# Patient Record
Sex: Male | Born: 1995
Health system: Southern US, Community
[De-identification: ages and names within clinical notes are randomized; demographics above are authoritative.]

## PROBLEM LIST (undated history)

## (undated) DIAGNOSIS — F952 Tourette's disorder: Secondary | ICD-10-CM

## (undated) DIAGNOSIS — J45909 Unspecified asthma, uncomplicated: Secondary | ICD-10-CM

## (undated) DIAGNOSIS — Z91018 Allergy to other foods: Secondary | ICD-10-CM

## (undated) DIAGNOSIS — R569 Unspecified convulsions: Secondary | ICD-10-CM

## (undated) HISTORY — PX: TONSILLECTOMY: SUR1361

---
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
## 2015-05-15 ENCOUNTER — Emergency Department (HOSPITAL_COMMUNITY)
Admission: EM | Admit: 2015-05-15 | Discharge: 2015-05-15 | Disposition: A | Discharge status: Home / Self Care | Payer: 59 | Attending: Emergency Medicine | Admitting: Emergency Medicine

## 2015-05-15 ENCOUNTER — Emergency Department (HOSPITAL_COMMUNITY): Payer: 59

## 2015-05-15 ENCOUNTER — Encounter (HOSPITAL_COMMUNITY): Payer: Self-pay | Admitting: Emergency Medicine

## 2015-05-15 DIAGNOSIS — R109 Unspecified abdominal pain: Secondary | ICD-10-CM | POA: Diagnosis present

## 2015-05-15 DIAGNOSIS — J45909 Unspecified asthma, uncomplicated: Secondary | ICD-10-CM | POA: Diagnosis not present

## 2015-05-15 DIAGNOSIS — I880 Nonspecific mesenteric lymphadenitis: Secondary | ICD-10-CM

## 2015-05-15 HISTORY — DX: Unspecified asthma, uncomplicated: J45.909

## 2015-05-15 HISTORY — DX: Unspecified convulsions: R56.9

## 2015-05-15 LAB — URINALYSIS, ROUTINE W REFLEX MICROSCOPIC
Bilirubin Urine: NEGATIVE
Glucose, UA: NEGATIVE mg/dL
Hgb urine dipstick: NEGATIVE
Ketones, ur: 40 mg/dL — AB
Leukocytes, UA: NEGATIVE
Nitrite: NEGATIVE
Protein, ur: NEGATIVE mg/dL
Specific Gravity, Urine: 1.02 (ref 1.005–1.030)
pH: 6.5 (ref 5.0–8.0)

## 2015-05-15 LAB — CBC
HCT: 48.2 % (ref 39.0–52.0)
Hemoglobin: 16 g/dL (ref 13.0–17.0)
MCH: 28.3 pg (ref 26.0–34.0)
MCHC: 33.2 g/dL (ref 30.0–36.0)
MCV: 85.3 fL (ref 78.0–100.0)
Platelets: 204 10*3/uL (ref 150–400)
RBC: 5.65 MIL/uL (ref 4.22–5.81)
RDW: 13.5 % (ref 11.5–15.5)
WBC: 6.5 10*3/uL (ref 4.0–10.5)

## 2015-05-15 LAB — COMPREHENSIVE METABOLIC PANEL
ALT: 33 U/L (ref 17–63)
AST: 32 U/L (ref 15–41)
Albumin: 5.2 g/dL — ABNORMAL HIGH (ref 3.5–5.0)
Alkaline Phosphatase: 68 U/L (ref 38–126)
Anion gap: 5 (ref 5–15)
BUN: 13 mg/dL (ref 6–20)
CO2: 29 mmol/L (ref 22–32)
Calcium: 9.9 mg/dL (ref 8.9–10.3)
Chloride: 104 mmol/L (ref 101–111)
Creatinine, Ser: 0.92 mg/dL (ref 0.61–1.24)
GFR calc Af Amer: >60 mL/min (ref >60)
GFR calc non Af Amer: >60 mL/min (ref >60)
Glucose, Bld: 115 mg/dL — ABNORMAL HIGH (ref 65–99)
Potassium: 4.9 mmol/L (ref 3.5–5.1)
Sodium: 138 mmol/L (ref 135–145)
Total Bilirubin: 1.3 mg/dL — ABNORMAL HIGH (ref 0.3–1.2)
Total Protein: 8.2 g/dL — ABNORMAL HIGH (ref 6.5–8.1)

## 2015-05-15 LAB — LIPASE, BLOOD: Lipase: 26 U/L (ref 11–51)

## 2015-05-15 IMAGING — DX DG CHEST 2V
2 series · 2 of 2 positions shown · non-contrast
Comparison: [DATE]

CLINICAL DATA: Right-sided chest pain

EXAM:
CHEST  2 VIEW

[chest pa]
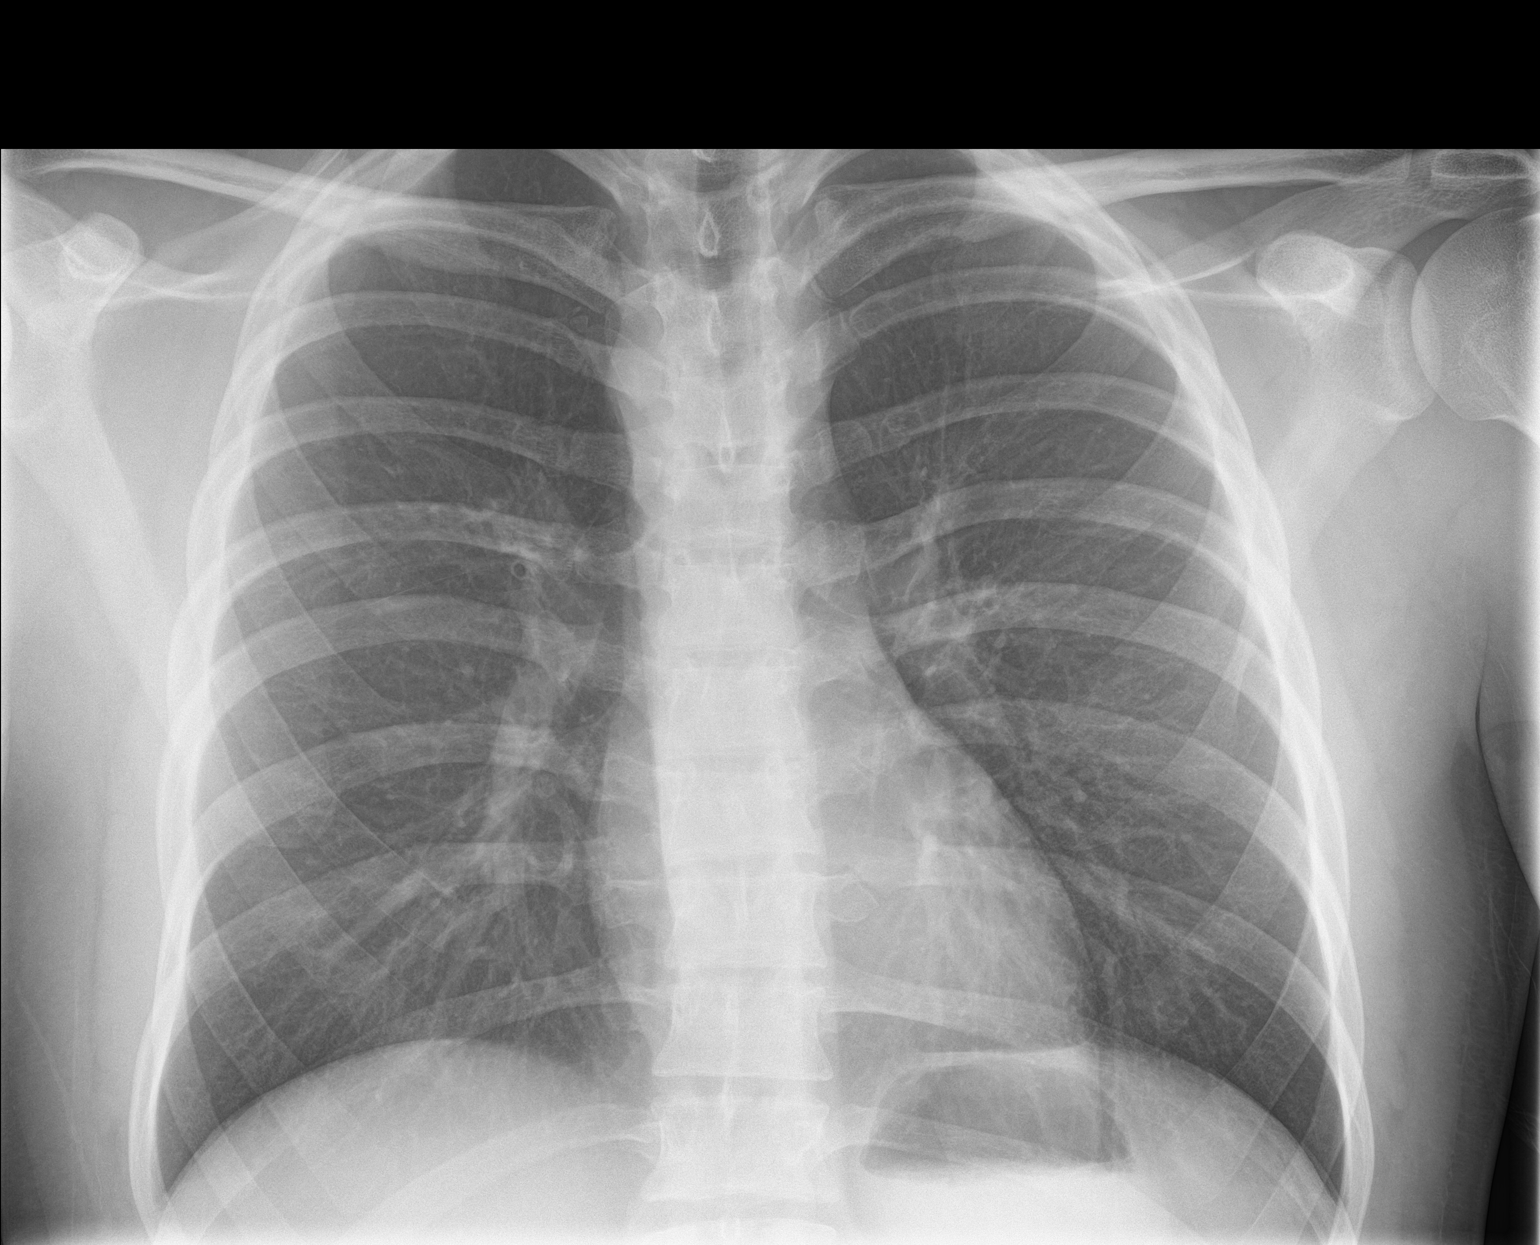

[chest lat]
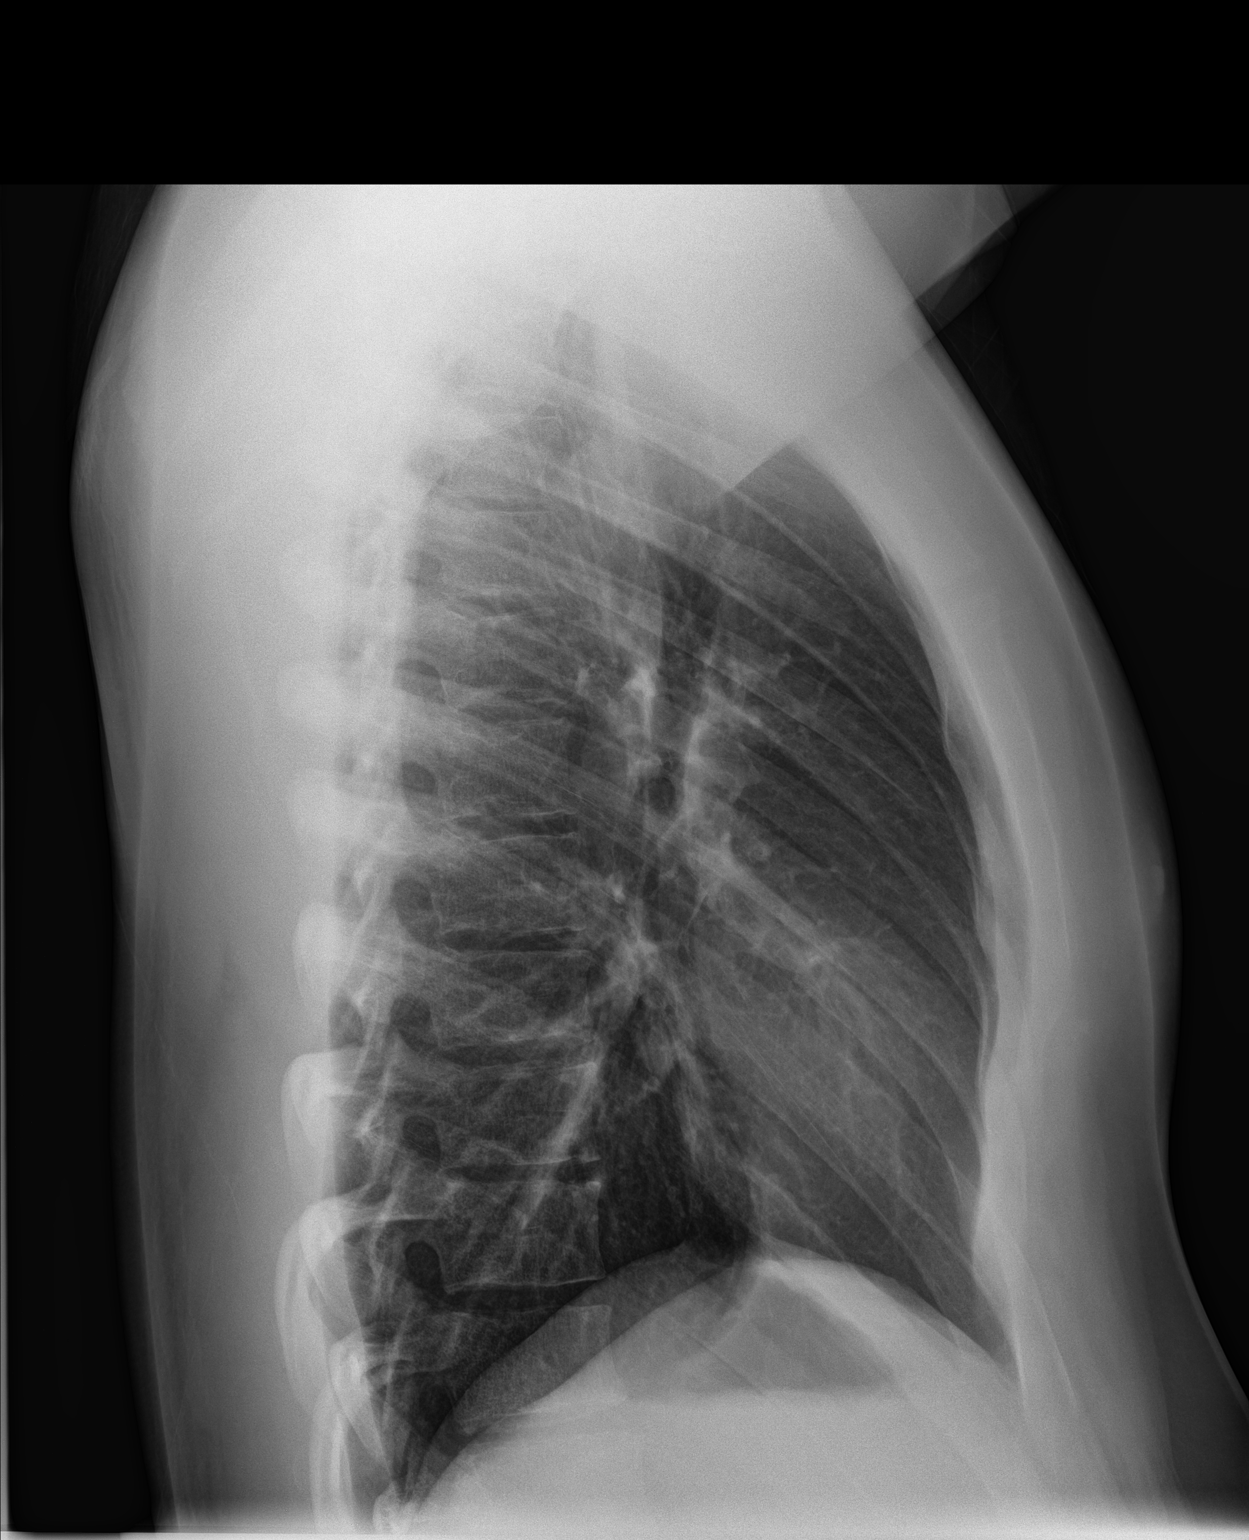

[2 of 2 positions shown; findings below may reference images not displayed]

FINDINGS: The heart size and mediastinal contours are within normal limits.
Both lungs are clear. The visualized skeletal structures are
unremarkable.
IMPRESSION: No active cardiopulmonary disease.

## 2015-05-15 IMAGING — CT CT ABD-PELV W/ CM
2 of 4 series · 16 of 46 positions shown, 18 images · IV contrast (Omnipaque 300)
Comparison: None.

CLINICAL DATA: Right-sided abdominal pain for 1 day.  Nausea.

EXAM:
CT ABDOMEN AND PELVIS WITH CONTRAST
TECHNIQUE: Multidetector CT imaging of the abdomen and pelvis was performed
using the standard protocol following bolus administration of
intravenous contrast. Oral contrast was also administered.
CONTRAST:  100mL OMNIPAQUE IOHEXOL 300 MG/ML  SOLN

[Series 2: abd_pel_with 5.0 b40f · axial · 0.78mm/px · z∈[-576,-131]mm · 13 of 97 slices shown, 15 images]
[im 4/97  soft-tissue]
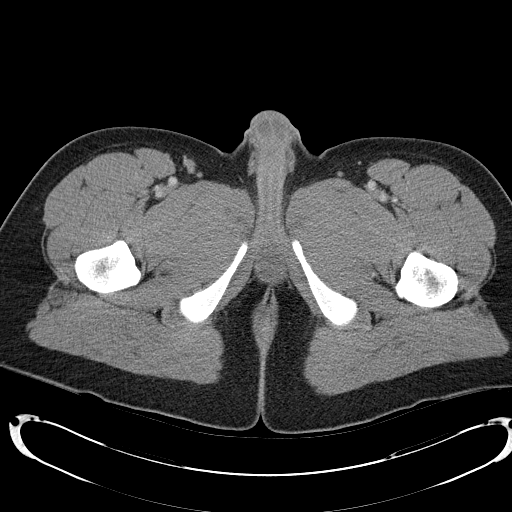
[im 4/97  bone]
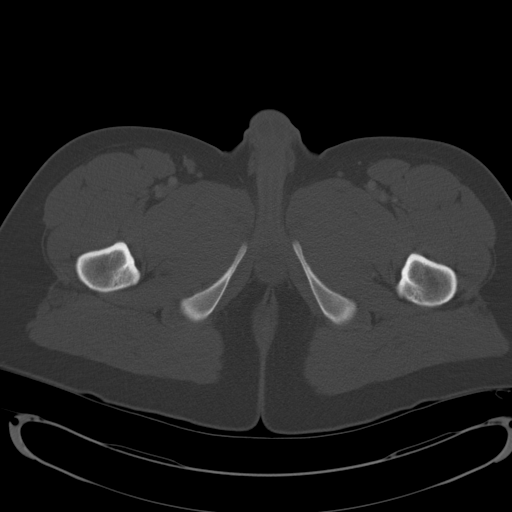
[im 12/97  soft-tissue]
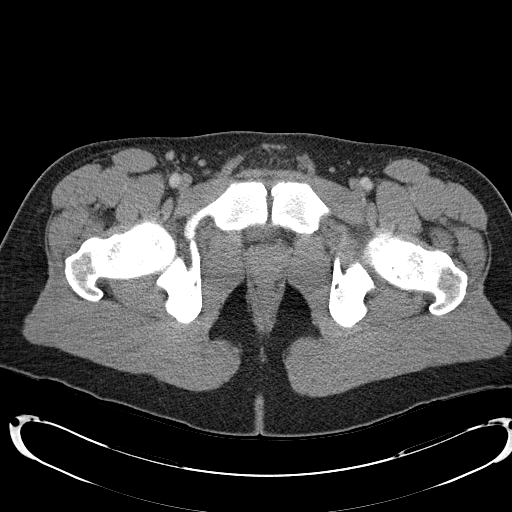
[im 20/97  soft-tissue]
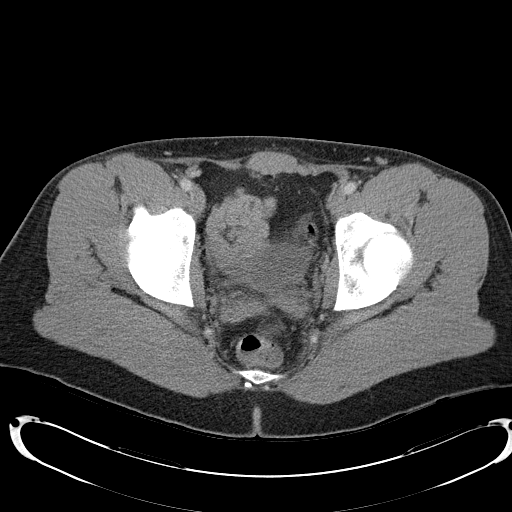
[im 27/97  soft-tissue]
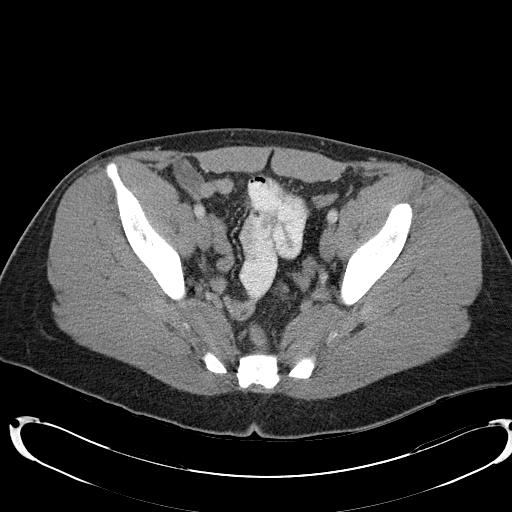
[im 35/97  soft-tissue]
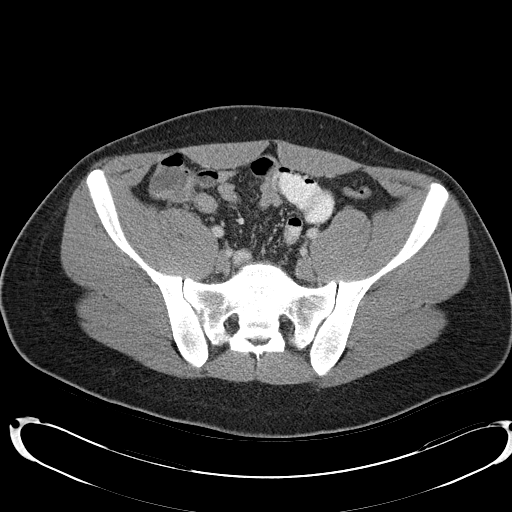
[im 43/97  soft-tissue]
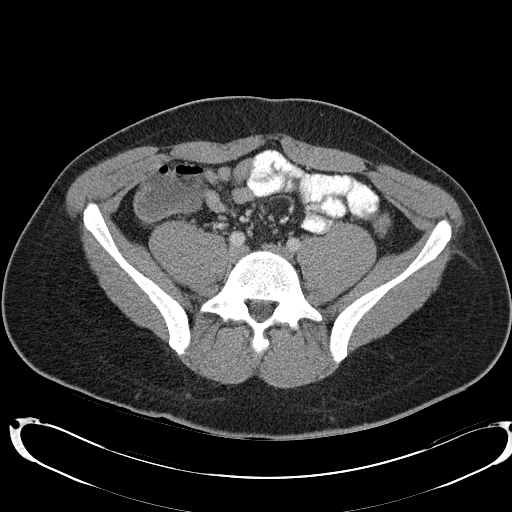
[im 50/97  soft-tissue]
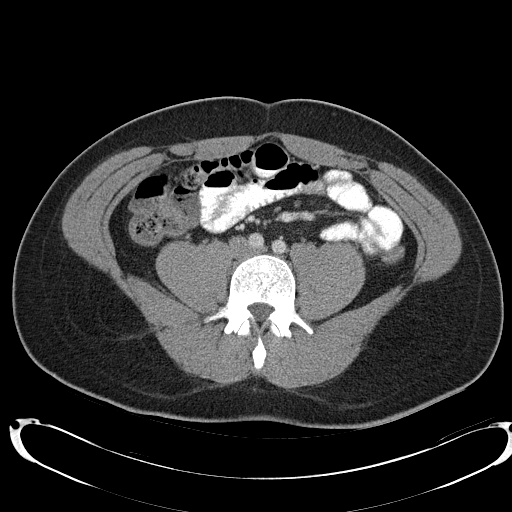
[im 54/97  soft-tissue]
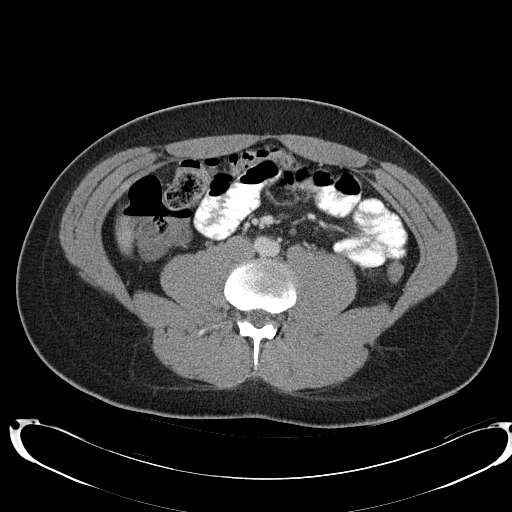
[im 62/97  soft-tissue]
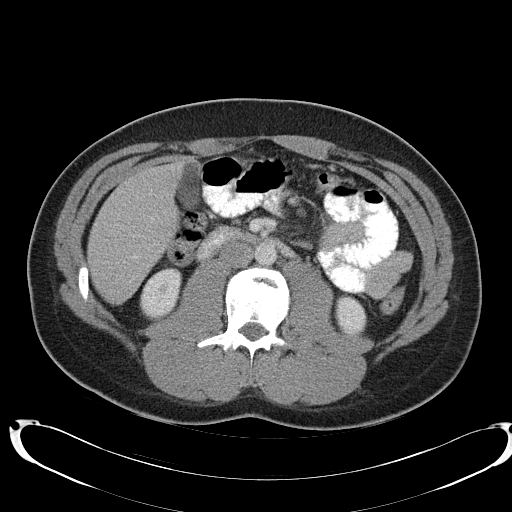
[im 62/97  bone]
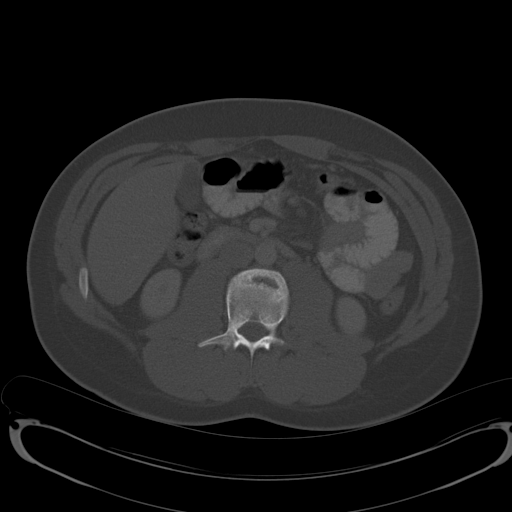
[im 70/97  soft-tissue]
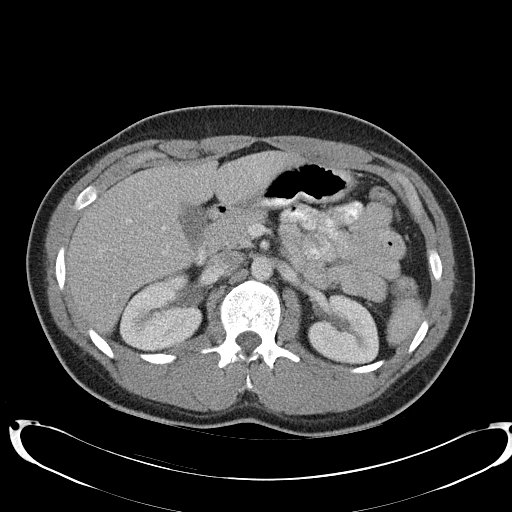
[im 77/97  soft-tissue]
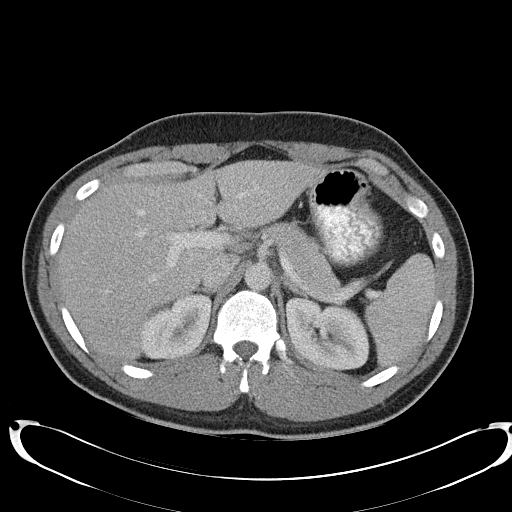
[im 85/97  soft-tissue]
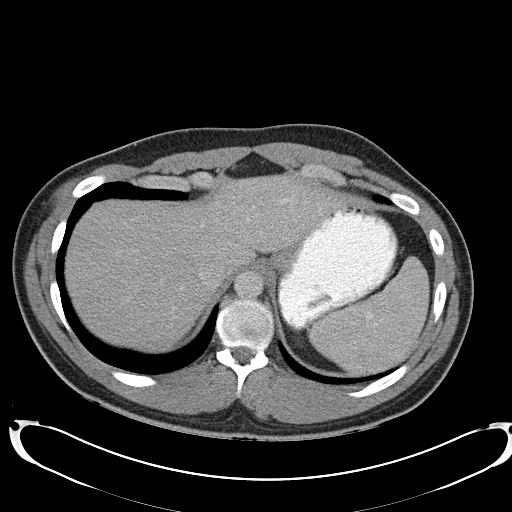
[im 93/97  soft-tissue]
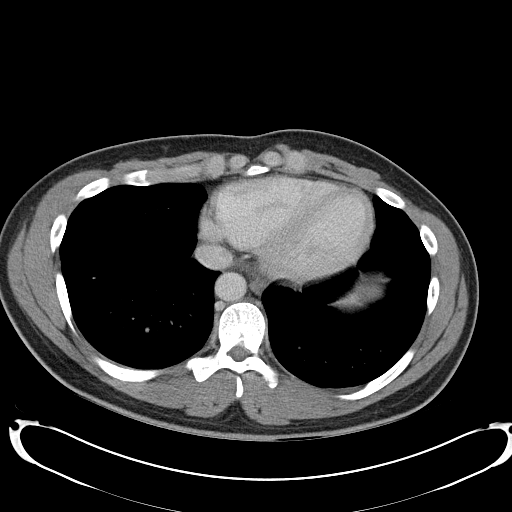

[Series 3: abd_pel_with 3.0 spo cor · coronal · 0.83mm/px · 3 of 81 slices shown]
[im 27/81  soft-tissue]
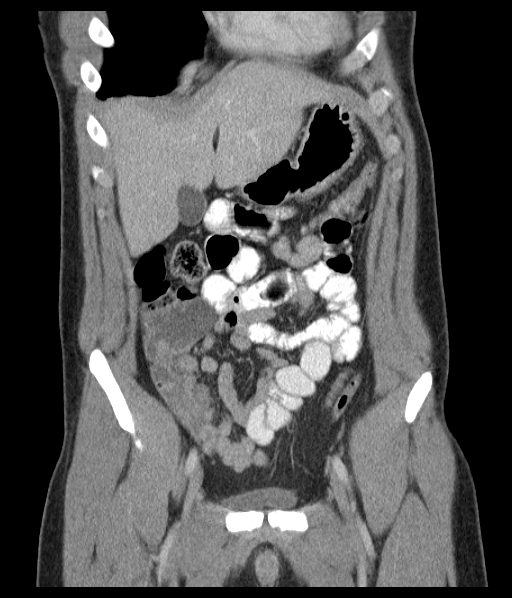
[im 36/81  soft-tissue]
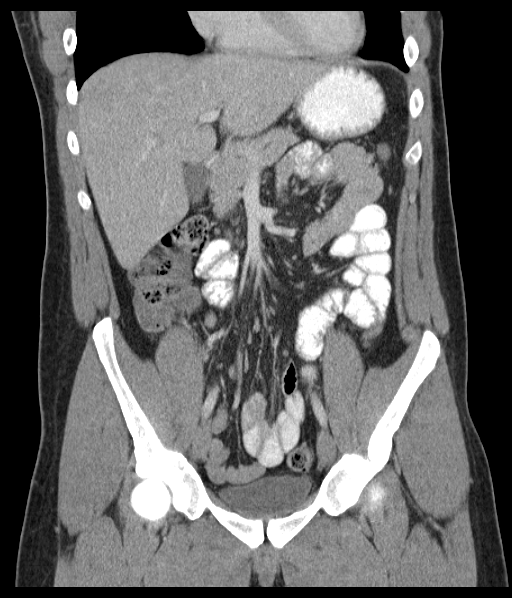
[im 45/81  soft-tissue]
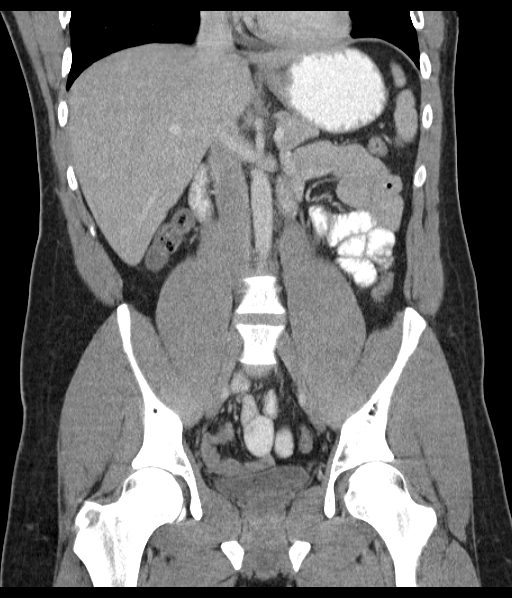

[16 of 46 positions shown; findings below may reference images not displayed]

FINDINGS: Lower chest:  Lung bases are clear.

Hepatobiliary: No focal liver lesions are identified. Gallbladder
wall is not appreciably thickened. There is no biliary duct
dilatation.

Pancreas: There is no demonstrable pancreatic mass or inflammatory
focus.

Spleen: No splenic lesions are identified.

Adrenals/Urinary Tract: Adrenals appear normal bilaterally. Kidneys
bilaterally show no evidence of mass or hydronephrosis on either
side. There is no demonstrable re- or ureteral calculus on either
side. Urinary bladder is midline with wall thickness within normal
limits.

Stomach/Bowel: There is no bowel wall or mesenteric thickening.
There are occasional sigmoid diverticula without diverticulitis.
There is no bowel obstruction. No free air or portal venous air.

Vascular/Lymphatic: No abdominal aortic aneurysm apparent. Major
mesenteric vessels appear patent. There is no appreciable adenopathy
by size criteria in the abdomen or pelvis. There are several
subcentimeter lymph nodes in the right mid to lower abdomen.

Reproductive: Prostate and seminal vesicles appear normal. There is
no demonstrable pelvic mass or pelvic fluid collection.

Other: There is no appendiceal inflammation. No abscess or ascites
is apparent in the abdomen or pelvis. There is a minimal ventral
hernia containing only fat.

Musculoskeletal: There are no blastic or lytic bone lesions. There
is no intramuscular or abdominal wall lesion.
IMPRESSION: There are several subcentimeter right-sided lymph nodes. This
finding potentially could indicate a degree of mesenteric adenitis.
By size criteria, there is no demonstrable adenopathy in the abdomen
or pelvis. There is no appendiceal region inflammation.

No bowel obstruction.  No abscess.

No renal or ureteral calculus.  No hydronephrosis.

There is a minimal ventral hernia containing only fat.

## 2015-05-15 MED ORDER — NAPROXEN 250 MG PO TABS: 250.0000 mg | ORAL_TABLET | Freq: Two times a day (BID) | ORAL | Status: DC | PRN

## 2015-05-15 MED ORDER — HYDROCODONE-ACETAMINOPHEN 5-325 MG PO TABS: ORAL_TABLET | ORAL | Status: DC

## 2015-05-15 MED ORDER — MORPHINE SULFATE (PF) 4 MG/ML IV SOLN
4.0000 mg | INTRAVENOUS | Status: DC | PRN
  Administered 2015-05-15: 4 mg via INTRAVENOUS
  Filled 2015-05-15: qty 1

## 2015-05-15 MED ORDER — ONDANSETRON HCL 4 MG/2ML IJ SOLN
4.0000 mg | INTRAMUSCULAR | Status: DC | PRN
Start: 2015-05-15 — End: 2015-05-15
  Administered 2015-05-15: 4 mg via INTRAVENOUS
  Filled 2015-05-15: qty 2

## 2015-05-15 MED ORDER — IOHEXOL 300 MG/ML  SOLN
100.0000 mL | Freq: Once | INTRAMUSCULAR | Status: AC | PRN
  Administered 2015-05-15: 100 mL via INTRAVENOUS

## 2015-05-15 MED ORDER — DIATRIZOATE MEGLUMINE & SODIUM 66-10 % PO SOLN
ORAL | Status: AC
  Administered 2015-05-15: 15 mL
  Filled 2015-05-15: qty 30

## 2015-05-15 MED ORDER — SODIUM CHLORIDE 0.9 % IV SOLN
INTRAVENOUS | Status: DC
  Administered 2015-05-15: 13:00:00 via INTRAVENOUS

## 2015-05-15 NOTE — ED Provider Notes (Signed)
CSN: [id number]     [id number] & time 05/15/15  1045 History   First MD Initiated Contact with Patient 05/15/15 1210     Chief Complaint  Patient presents with  . Abdominal Pain      HPI Pt was seen at 1210. Per pt and his mother, c/o gradual onset and persistence of constant right sided abd "pain" for the past 2 days. Has been associated with nausea. Pt states he woke up yesterday with symptoms "after I ate [employer] before going to bed." States he has still been able to eat yesterday despite his symptoms. Pt was evaluated by his PMD this morning, then sent to the ED for further evaluation. Denies fevers, no vomiting/diarrhea, no CP/SOB, no back pain.   Past Medical History  Diagnosis Date  . Asthma   . Seizures (HCC)     febrile seizures   Past Surgical History  Procedure Laterality Date  . Tonsillectomy      Social History  Substance Use Topics  . Smoking status: Never Smoker   . Smokeless tobacco: None  . Alcohol Use: No    Review of Systems ROS: Statement: All systems negative except as marked or noted in the HPI; Constitutional: Negative for fever and chills. ; ; Eyes: Negative for eye pain, redness and discharge. ; ; ENMT: Negative for ear pain, hoarseness, nasal congestion, sinus pressure and sore throat. ; ; Cardiovascular: Negative for chest pain, palpitations, diaphoresis, dyspnea and peripheral edema. ; ; Respiratory: Negative for cough, wheezing and stridor. ; ; Gastrointestinal: +nausea, abd pain. Negative for vomiting, diarrhea, blood in stool, hematemesis, jaundice and rectal bleeding. . ; ; Genitourinary: Negative for dysuria, flank pain and hematuria. ; ; Genital:  No penile drainage or rash, no testicular pain or swelling, no scrotal rash or swelling. ;; Musculoskeletal: Negative for back pain and neck pain. Negative for swelling and trauma.; ; Skin: Negative for pruritus, rash, abrasions, blisters, bruising and skin lesion.; ; Neuro: Negative for headache,  lightheadedness and neck stiffness. Negative for weakness, altered level of consciousness , altered mental status, extremity weakness, paresthesias, involuntary movement, seizure and syncope.      Allergies  Review of patient's allergies indicates no known allergies.  Home Medications   Prior to Admission medications   Not on File   BP 137/73 mmHg  Pulse 76  Temp(Src) 98.4 F (36.9 C) (Oral)  Resp 18  Ht 6' (1.829 m)  Wt 236 lb (107.049 kg)  BMI 32.00 kg/m2  SpO2 100% Physical Exam  1215: Physical examination:  Nursing notes reviewed; Vital signs and O2 SAT reviewed;  Constitutional: Well developed, Well nourished, Well hydrated, In no acute distress; Head:  Normocephalic, atraumatic; Eyes: EOMI, PERRL, No scleral icterus; ENMT: Mouth and pharynx normal, Mucous membranes moist; Neck: Supple, Full range of motion, No lymphadenopathy; Cardiovascular: Regular rate and rhythm, No murmur, rub, or gallop; Respiratory: Breath sounds clear & equal bilaterally, No rales, rhonchi, wheezes.  Speaking full sentences with ease, Normal respiratory effort/excursion; Chest: Nontender, Movement normal; Abdomen: Soft, +RUQ > diffuse tenderness to palp. Nondistended, Normal bowel sounds; Genitourinary: No CVA tenderness; Extremities: Pulses normal, No tenderness, No edema, No calf edema or asymmetry.; Neuro: AA&Ox3, Major CN grossly intact.  Speech clear. No gross focal motor or sensory deficits in extremities.; Skin: Color normal, Warm, Dry.   ED Course  Procedures (including critical care time) Labs Review  Imaging Review  I have personally reviewed and evaluated these images and lab results as part of  my medical decision-making.   EKG Interpretation None      MDM  MDM Reviewed: previous chart, nursing note and vitals Reviewed previous: labs Interpretation: labs, CT scan and x-ray     Results for orders placed or performed during the hospital encounter of 05/15/15  Comprehensive  metabolic panel  Result Value Ref Range   Sodium 138 135 - 145 mmol/L   Potassium 4.9 3.5 - 5.1 mmol/L   Chloride 104 101 - 111 mmol/L   CO2 29 22 - 32 mmol/L   Glucose, Bld 115 (H) 65 - 99 mg/dL   BUN 13 6 - 20 mg/dL   Creatinine, Ser 1.30 0.61 - 1.24 mg/dL   Calcium 9.9 8.9 - 86.5 mg/dL   Total Protein 8.2 (H) 6.5 - 8.1 g/dL   Albumin 5.2 (H) 3.5 - 5.0 g/dL   AST 32 15 - 41 U/L   ALT 33 17 - 63 U/L   Alkaline Phosphatase 68 38 - 126 U/L   Total Bilirubin 1.3 (H) 0.3 - 1.2 mg/dL   GFR calc non Af Amer >60 >60 mL/min   GFR calc Af Amer >60 >60 mL/min   Anion gap 5 5 - 15  CBC  Result Value Ref Range   WBC 6.5 4.0 - 10.5 K/uL   RBC 5.65 4.22 - 5.81 MIL/uL   Hemoglobin 16.0 13.0 - 17.0 g/dL   HCT 78.4 69.6 - 29.5 %   MCV 85.3 78.0 - 100.0 fL   MCH 28.3 26.0 - 34.0 pg   MCHC 33.2 30.0 - 36.0 g/dL   RDW 28.4 13.2 - 44.0 %   Platelets 204 150 - 400 K/uL  Urinalysis, Routine w reflex microscopic (not at [facility])  Result Value Ref Range   Color, Urine YELLOW YELLOW   APPearance CLEAR CLEAR   Specific Gravity, Urine 1.020 1.005 - 1.030   pH 6.5 5.0 - 8.0   Glucose, UA NEGATIVE NEGATIVE mg/dL   Hgb urine dipstick NEGATIVE NEGATIVE   Bilirubin Urine NEGATIVE NEGATIVE   Ketones, ur 40 (A) NEGATIVE mg/dL   Protein, ur NEGATIVE NEGATIVE mg/dL   Nitrite NEGATIVE NEGATIVE   Leukocytes, UA NEGATIVE NEGATIVE  Lipase, blood  Result Value Ref Range   Lipase 26 11 - 51 U/L   Dg Chest 2 View 05/15/2015  CLINICAL DATA:  Right-sided chest pain EXAM: CHEST  2 VIEW COMPARISON:  01/22/2005 FINDINGS: The heart size and mediastinal contours are within normal limits. Both lungs are clear. The visualized skeletal structures are unremarkable. IMPRESSION: No active cardiopulmonary disease. Electronically Signed   By: Alcide Clever M.D.   On: 05/15/2015 13:26   Ct Abdomen Pelvis W Contrast 05/15/2015  CLINICAL DATA:  Right-sided abdominal pain for 1 day.  Nausea. EXAM: CT ABDOMEN AND PELVIS WITH CONTRAST  TECHNIQUE: Multidetector CT imaging of the abdomen and pelvis was performed using the standard protocol following bolus administration of intravenous contrast. Oral contrast was also administered. CONTRAST:  OMNIPAQUE IOHEXOL 300 MG/ML  SOLN COMPARISON:  None. FINDINGS: Lower chest:  Lung bases are clear. Hepatobiliary: No focal liver lesions are identified. Gallbladder wall is not appreciably thickened. There is no biliary duct dilatation. Pancreas: There is no demonstrable pancreatic mass or inflammatory focus. Spleen: No splenic lesions are identified. Adrenals/Urinary Tract: Adrenals appear normal bilaterally. Kidneys bilaterally show no evidence of mass or hydronephrosis on either side. There is no demonstrable re- or ureteral calculus on either side. Urinary bladder is midline with wall thickness within normal limits. Stomach/Bowel: There  is no bowel wall or mesenteric thickening. There are occasional sigmoid diverticula without diverticulitis. There is no bowel obstruction. No free air or portal venous air. Vascular/Lymphatic: No abdominal aortic aneurysm apparent. Major mesenteric vessels appear patent. There is no appreciable adenopathy by size criteria in the abdomen or pelvis. There are several subcentimeter lymph nodes in the right mid to lower abdomen. Reproductive: Prostate and seminal vesicles appear normal. There is no demonstrable pelvic mass or pelvic fluid collection. Other: There is no appendiceal inflammation. No abscess or ascites is apparent in the abdomen or pelvis. There is a minimal ventral hernia containing only fat. Musculoskeletal: There are no blastic or lytic bone lesions. There is no intramuscular or abdominal wall lesion. IMPRESSION: There are several subcentimeter right-sided lymph nodes. This finding potentially could indicate a degree of mesenteric adenitis. By size criteria, there is no demonstrable adenopathy in the abdomen or pelvis. There is no appendiceal region  inflammation. No bowel obstruction.  No abscess. No renal or ureteral calculus.  No hydronephrosis. There is a minimal ventral hernia containing only fat. Electronically Signed   By: Bretta Bang III M.D.   On: 05/15/2015 14:12    1445:  Pt has tol PO well while in the ED without N/V.  No stooling while in the ED. VSS. Feels better and wants to go home now. Workup reassuring. Tx symptomatically at this time. Dx and testing d/w pt and family.  Questions answered.  Verb understanding, agreeable to d/c home with outpt f/u.    Samuel Jester, DO 05/19/15 1334

## 2015-05-15 NOTE — Discharge Instructions (Signed)
°Emergency Department Resource Guide °1) Find a Doctor and Pay Out of Pocket °Although you won't have to find out who is covered by your insurance plan, it is a good idea to ask around and get recommendations. You will then need to call the office and see if the doctor you have chosen will accept you as a new patient and what types of options they offer for patients who are self-pay. Some doctors offer discounts or will set up payment plans for their patients who do not have insurance, but you will need to ask so you aren't surprised when you get to your appointment. ° °2) Contact Your Local Health Department °Not all health departments have doctors that can see patients for sick visits, but many do, so it is worth a call to see if yours does. If you don't know where your local health department is, you can check in your phone book. The CDC also has a tool to help you locate your state's health department, and many state websites also have listings of all of their local health departments. ° °3) Find a Walk-in Clinic °If your illness is not likely to be very severe or complicated, you may want to try a walk in clinic. These are popping up all over the country in pharmacies, drugstores, and shopping centers. They're usually staffed by nurse practitioners or physician assistants that have been trained to treat common illnesses and complaints. They're usually fairly quick and inexpensive. However, if you have serious medical issues or chronic medical problems, these are probably not your best option. ° °No Primary Care Doctor: °- Call [facility] at  832-8000 - they can help you locate a primary care doctor that  accepts your insurance, provides certain services, etc. °- Physician Referral Service- 1-800-533-3463 ° °Chronic Pain Problems: °Organization         Address  Phone   Notes  °[facility]  (336) 297-2271 Patients need to be referred by their primary care doctor.  ° °Medication  Assistance: °Organization         Address  Phone   Notes  °[place] [facility] [address] °[place], [place] [address] (336) 641-8030 --Must be a resident of [place] °-- Must have NO insurance coverage whatsoever (no Medicaid/ Medicare, etc.) °-- The pt. MUST have a primary care doctor that directs their care regularly and follows them in the community °  °[facility]  (866) 331-1348   °[employer]  (888) 892-1162   ° °Agencies that provide inexpensive medical care: °Organization         Address  Phone   Notes  °[facility]  (336) 832-8035   °[facility]    (336) 832-7272   °[facility] [address] °[place], [place] [address] (336) 832-4777   °[facility] [address], °[place] (336) 271-4999   °[facility]    (336) 373-0678   °[facility]    (336) 272-1050   °[facility] ° [address], [place] Phone:  (336) 832-4444, Fax:  (336) 832-4440 Hours of Operation:  9 am - 6 pm, M-F.  Also accepts Medicaid/Medicare and self-pay.  °[facility] ° [address], [place] Phone: (336) 832-3150, Fax: (336) 832-3151. Hours of Operation:  8:30 am - 5:30 pm, M-F.  Also accepts Medicaid and self-pay.  °[facility] [place] [address], [place] Phone: (336) 878-6027   °[facility] [address], [place], [place] (336)723-1848, Ext. 123 Mondays & Thursdays: 7-9 AM.  First 15 patients are seen on a first come, first serve basis. °  ° °Medicaid-accepting [place] Providers: ° °Organization         Address  Phone   Notes  °[facility] [address], [place] (336) 641-2100 Also accepts self-pay patients.  °[facility] [address], [place] ° (336) 856-9996   °[facility] [address], [place]  (336) 288-8857   °[facility] [address] [place] [address], [place] (336) 299-7000   °Veita Bland [address], [place]  ° (336) 373-1557 Only accepts [place] Access Medicaid patients after they have their name applied to their card.  ° °Self-Pay (no insurance) in [place]: ° °Organization         Address  Phone   Notes  °Sickle Cell Patients, [facility] [address], [place] (336) 832-1970   °[facility] [address], [place] (336) 832-4400   °[facility] ° [address], [place] (336) 992-4800   °[facility]/Dr. Osei-Bonsu ° [address] [place] [address], [place] or [address], [place] (336) [phone] Phone number for both [place] and [place] locations is the same.  °[facility] [address], [place] (336) 299-0000   °[facility] [address] [place] [address], [place] or [address] (336) 852-7530 °(336) 878-2260   °[facility] [address], [place] (336) 350-1642, phone; (336) 294-5005, fax Sees patients 1st and 3rd Saturday of every month.  Must not qualify for public or private insurance (i.e. Medicaid, Medicare, [place] Health Choice, Veterans' Benefits) • Household income should be no more than 200% of the poverty level •The clinic cannot treat you if you are pregnant or think you are pregnant • Sexually transmitted diseases are not treated at the clinic.  ° ° °Dental Care: °Organization         Address  Phone  Notes  °[place] [facility] [address], [place] (336) 641-6152 Accepts children up to age 21 who are enrolled in Medicaid or [place] Health Choice; pregnant women with a Medicaid card; and children who have applied for Medicaid or [place] Health Choice, but were declined, whose parents can pay a reduced fee at time of service.  °[place]  [facility] [place]  [address], [place] (336) 641-7733 Accepts children up to age 21 who are enrolled in Medicaid or [place] Health Choice; pregnant women with a Medicaid card; and children who have applied for Medicaid or [place] Health Choice, but were declined, whose parents can pay a reduced fee at time of service.  °[facility] ° [address], [place] (336) 641-4533 Patients are seen by appointment only. Walk-ins are not accepted. [facility] will see patients 18 years of age and older. °Monday - Tuesday (8am-5pm) °Most Wednesdays (8:30-5pm) °$30 per visit, cash only  °[facility] ° [address], [place] (336) 641-4533 Patients are seen by appointment only. Walk-ins are not accepted. [facility] will see patients 18 years of age and older. °One   Wednesday Evening (Monthly: Volunteer Based).  $30 per visit, cash only  °[facility]  (919) 537-3737 for adults; Children under age 4, call Graduate Pediatric Dentistry at ([phone]) [phone]. Children aged 4-14, please call ([phone]) [phone] to request a pediatric application. ° Dental services are provided in all areas of dental care including fillings, crowns and bridges, complete and partial dentures, implants, gum treatment, root canals, and extractions. Preventive care is also provided. Treatment is provided to both adults and children. °Patients are selected via a lottery and there is often a waiting list. °  °[facility] [address], °[place] ° (336) 763-8833 [email or web address].[email or web address] °  °[facility] [address], [place], [place] (336)723-1848, Ext. 123 Second and Fourth Thursday of each month, opens at 6:30 AM; Clinic ends at 9 AM.  Patients are seen on a first-come first-served basis, and a limited number are seen during each clinic.  ° °[facility] ° [address], [place], [place] (336) 723-7904    Eligibility Requirements °You must have lived in [place], [place], or [place] counties for at least the last three months. °  You cannot be eligible for state or federal sponsored healthcare insurance, including Veterans Administration, Medicaid, or Medicare. °  You generally cannot be eligible for healthcare insurance through your employer.  °  How to apply: °Eligibility screenings are held every Tuesday and Wednesday afternoon from 1:00 pm until 4:00 pm. You do not need an appointment for the interview!  °[facility] [address], [place], [place] 336-631-2330   °[facility]  336-342-8273   °[facility]  336-703-3100   °[facility]  336-570-6415   ° °Behavioral Health Resources in the Community: °Intensive Outpatient Programs °Organization         Address  Phone  Notes  °[facility] [address], [place], [place] 336-878-6098   °[facility] [address], [place], [place] 336-832-9800   °[facility] [address], [place], [place] ° 336-882-2125   °[facility] [address],  °[place], [place] 1-800-853-5163 or 336-641-4981   °Substance Abuse Resources °Organization         Address  Phone  Notes  °[facility]  336-882-2125   °[facility]  336-784-9470   °[facility]  336-285-9073   °[facility]  336-845-3988   °Residential & Outpatient Substance Abuse Program  1-800-659-3381   °Psychological Services °Organization         Address  Phone  Notes  °[facility]  336- 832-9600   °[facility]  336- 378-7881   °[facility] [address], [place] 1-800-853-5163 or 336-641-4981   ° °Mobile Crisis Teams °Organization         Address  Phone  Notes  °[facility], Mobile Crisis Care Unit  1-877-626-1772   °Assertive °[facility] ° [address]  [place], [place] 336-834-9664   °Sharon DeEsch [address] °[place] 336-554-5454   ° °Self-Help/Support Groups °Organization         Address  Phone             Notes  °[facility]  - variety of support groups  336- 373-1402 Call for more information  °Narcotics Anonymous (NA), [facility] [address], °[place]  2 meetings at this location  ° °  Residential Treatment Programs Organization         Address  Phone  Notes  [facility] [address]    [place]  6-950-722-5750   [facility]  [address] [place] [address], [place], [place] 825-189-8421   [facility] [address] [place], [place] 031-281-1886 Admissions: 8am-3pm M-F  [facility] [address]    [place], [place] 773-736-6815   [facility] [address] [place], [place], [place] 947-076-1518   [facility] [address]  [place], [place] 343-735-7897   [facility] Intensive Outpatient [address], [place], [place] 847-841-2820   [facility] ([facility] [address] [place].,  [place], [place] 8-138-871-9597 or 407-245-9466   [facility] [address], [place], [place] 682-574-9355 Accepts Medicaid  [facility] [address]  [place] 2-174-715-9539 Substance Abuse/Addiction Treatment   [facility]         Address  Phone  Notes  [facility]  425-015-9493   Angie Fava, PhD [address] [place], [place]   (321)239-6010 or 623-572-0022   [facility]   [address] [place], [place] 586-367-1824   [facility] [address] [place], [place] 806-758-3290 Insurance/Medicaid/sponsorship through [facility] [address]                                    [place], [place] [phone] Therapy/tele-psych/case    [facility] [address], [place] (772)814-5287    Dr. Lolly Mustache  (201) [phone]   [facility]  [employer] [facility]. 1) [address] [place] 2) [address], [place] 3)  [address], [place] (310)[phone] (479) [phone]  (940)482-0087   [facility] Child Abuse Hotline 347-358-2754 or (541)781-5883 (After Hours)      Take the prescriptions as directed.  Increase your fluid intake (ie:  Gatoraide) for the next few days, as discussed.  Eat a bland diet and advance to your regular diet slowly as you can tolerate it.  Call your regular medical doctor tomorrow to schedule a follow up appointment this week.  Return to the Emergency Department immediately if not improving (or even worsening) despite taking the medicines as prescribed, any black or bloody stool or vomit, if you develop a fever over "101," or for any other concerns.

## 2015-05-15 NOTE — ED Notes (Signed)
EDP at bedside  

## 2015-05-15 NOTE — ED Notes (Signed)
Pt c/o mid to RT abdominal pain since yesterday. Pt sent over by Dr. Phillips Odor for a possible appendicitis. Pt reports nausea, but no vomiting or diarrhea. Pt has rebound tenderness to RUQ.

## 2015-05-15 NOTE — ED Notes (Addendum)
Pt finished drinking po contrast. nad noted. CT aware.

## 2016-08-17 ENCOUNTER — Encounter (HOSPITAL_COMMUNITY): Payer: Self-pay

## 2016-08-17 ENCOUNTER — Emergency Department (HOSPITAL_COMMUNITY): Payer: 59

## 2016-08-17 ENCOUNTER — Other Ambulatory Visit: Payer: Self-pay

## 2016-08-17 ENCOUNTER — Encounter (HOSPITAL_COMMUNITY)
Admission: AD | Disposition: A | Discharge status: Home / Self Care | Payer: Self-pay | Source: Ambulatory Visit | Attending: Gastroenterology

## 2016-08-17 ENCOUNTER — Encounter (HOSPITAL_COMMUNITY): Payer: Self-pay | Admitting: *Deleted

## 2016-08-17 ENCOUNTER — Telehealth: Payer: Self-pay | Admitting: Gastroenterology

## 2016-08-17 ENCOUNTER — Emergency Department (HOSPITAL_COMMUNITY)
Admission: EM | Admit: 2016-08-17 | Discharge: 2016-08-17 | Disposition: A | Discharge status: Home / Self Care | Payer: 59 | Source: Home / Self Care | Attending: Emergency Medicine | Admitting: Emergency Medicine

## 2016-08-17 ENCOUNTER — Ambulatory Visit (HOSPITAL_COMMUNITY)
Admission: AD | Admit: 2016-08-17 | Discharge: 2016-08-17 | Disposition: A | Discharge status: Home / Self Care | Payer: 59 | Source: Ambulatory Visit | Attending: Gastroenterology | Admitting: Gastroenterology

## 2016-08-17 DIAGNOSIS — T185XXA Foreign body in anus and rectum, initial encounter: Secondary | ICD-10-CM | POA: Insufficient documentation

## 2016-08-17 DIAGNOSIS — T185XXS Foreign body in anus and rectum, sequela: Secondary | ICD-10-CM

## 2016-08-17 DIAGNOSIS — X58XXXA Exposure to other specified factors, initial encounter: Secondary | ICD-10-CM

## 2016-08-17 DIAGNOSIS — Q438 Other specified congenital malformations of intestine: Secondary | ICD-10-CM | POA: Diagnosis not present

## 2016-08-17 DIAGNOSIS — Y999 Unspecified external cause status: Secondary | ICD-10-CM

## 2016-08-17 DIAGNOSIS — Z538 Procedure and treatment not carried out for other reasons: Secondary | ICD-10-CM | POA: Diagnosis not present

## 2016-08-17 DIAGNOSIS — Y939 Activity, unspecified: Secondary | ICD-10-CM | POA: Insufficient documentation

## 2016-08-17 DIAGNOSIS — J45909 Unspecified asthma, uncomplicated: Secondary | ICD-10-CM | POA: Insufficient documentation

## 2016-08-17 DIAGNOSIS — Y929 Unspecified place or not applicable: Secondary | ICD-10-CM

## 2016-08-17 DIAGNOSIS — K648 Other hemorrhoids: Secondary | ICD-10-CM | POA: Insufficient documentation

## 2016-08-17 HISTORY — PX: FLEXIBLE SIGMOIDOSCOPY: SHX5431

## 2016-08-17 LAB — CBC WITH DIFFERENTIAL/PLATELET
Basophils Absolute: 0 10*3/uL (ref 0.0–0.1)
Basophils Relative: 1 %
Eosinophils Absolute: 0.1 10*3/uL (ref 0.0–0.7)
Eosinophils Relative: 2 %
HCT: 42 % (ref 39.0–52.0)
Hemoglobin: 14.4 g/dL (ref 13.0–17.0)
Lymphocytes Relative: 32 %
Lymphs Abs: 2 10*3/uL (ref 0.7–4.0)
MCH: 28.9 pg (ref 26.0–34.0)
MCHC: 34.3 g/dL (ref 30.0–36.0)
MCV: 84.2 fL (ref 78.0–100.0)
Monocytes Absolute: 0.7 10*3/uL (ref 0.1–1.0)
Monocytes Relative: 12 %
Neutro Abs: 3.3 10*3/uL (ref 1.7–7.7)
Neutrophils Relative %: 53 %
Platelets: 185 10*3/uL (ref 150–400)
RBC: 4.99 MIL/uL (ref 4.22–5.81)
RDW: 13.2 % (ref 11.5–15.5)
WBC: 6.2 10*3/uL (ref 4.0–10.5)

## 2016-08-17 LAB — COMPREHENSIVE METABOLIC PANEL
ALT: 21 U/L (ref 17–63)
AST: 21 U/L (ref 15–41)
Albumin: 4.2 g/dL (ref 3.5–5.0)
Alkaline Phosphatase: 68 U/L (ref 38–126)
Anion gap: 6 (ref 5–15)
BUN: 12 mg/dL (ref 6–20)
CO2: 29 mmol/L (ref 22–32)
Calcium: 9.6 mg/dL (ref 8.9–10.3)
Chloride: 107 mmol/L (ref 101–111)
Creatinine, Ser: 1.12 mg/dL (ref 0.61–1.24)
GFR calc Af Amer: >60 mL/min (ref >60)
GFR calc non Af Amer: >60 mL/min (ref >60)
Glucose, Bld: 100 mg/dL — ABNORMAL HIGH (ref 65–99)
Potassium: 4.5 mmol/L (ref 3.5–5.1)
Sodium: 142 mmol/L (ref 135–145)
Total Bilirubin: 0.6 mg/dL (ref 0.3–1.2)
Total Protein: 7.2 g/dL (ref 6.5–8.1)

## 2016-08-17 IMAGING — DX DG ABDOMEN 1V
3 series · 3 of 3 positions shown · non-contrast
Comparison: None.

CLINICAL DATA: Foreign body in rectum.

EXAM:
ABDOMEN - 1 VIEW

[abdomen kub (1 of 3)]
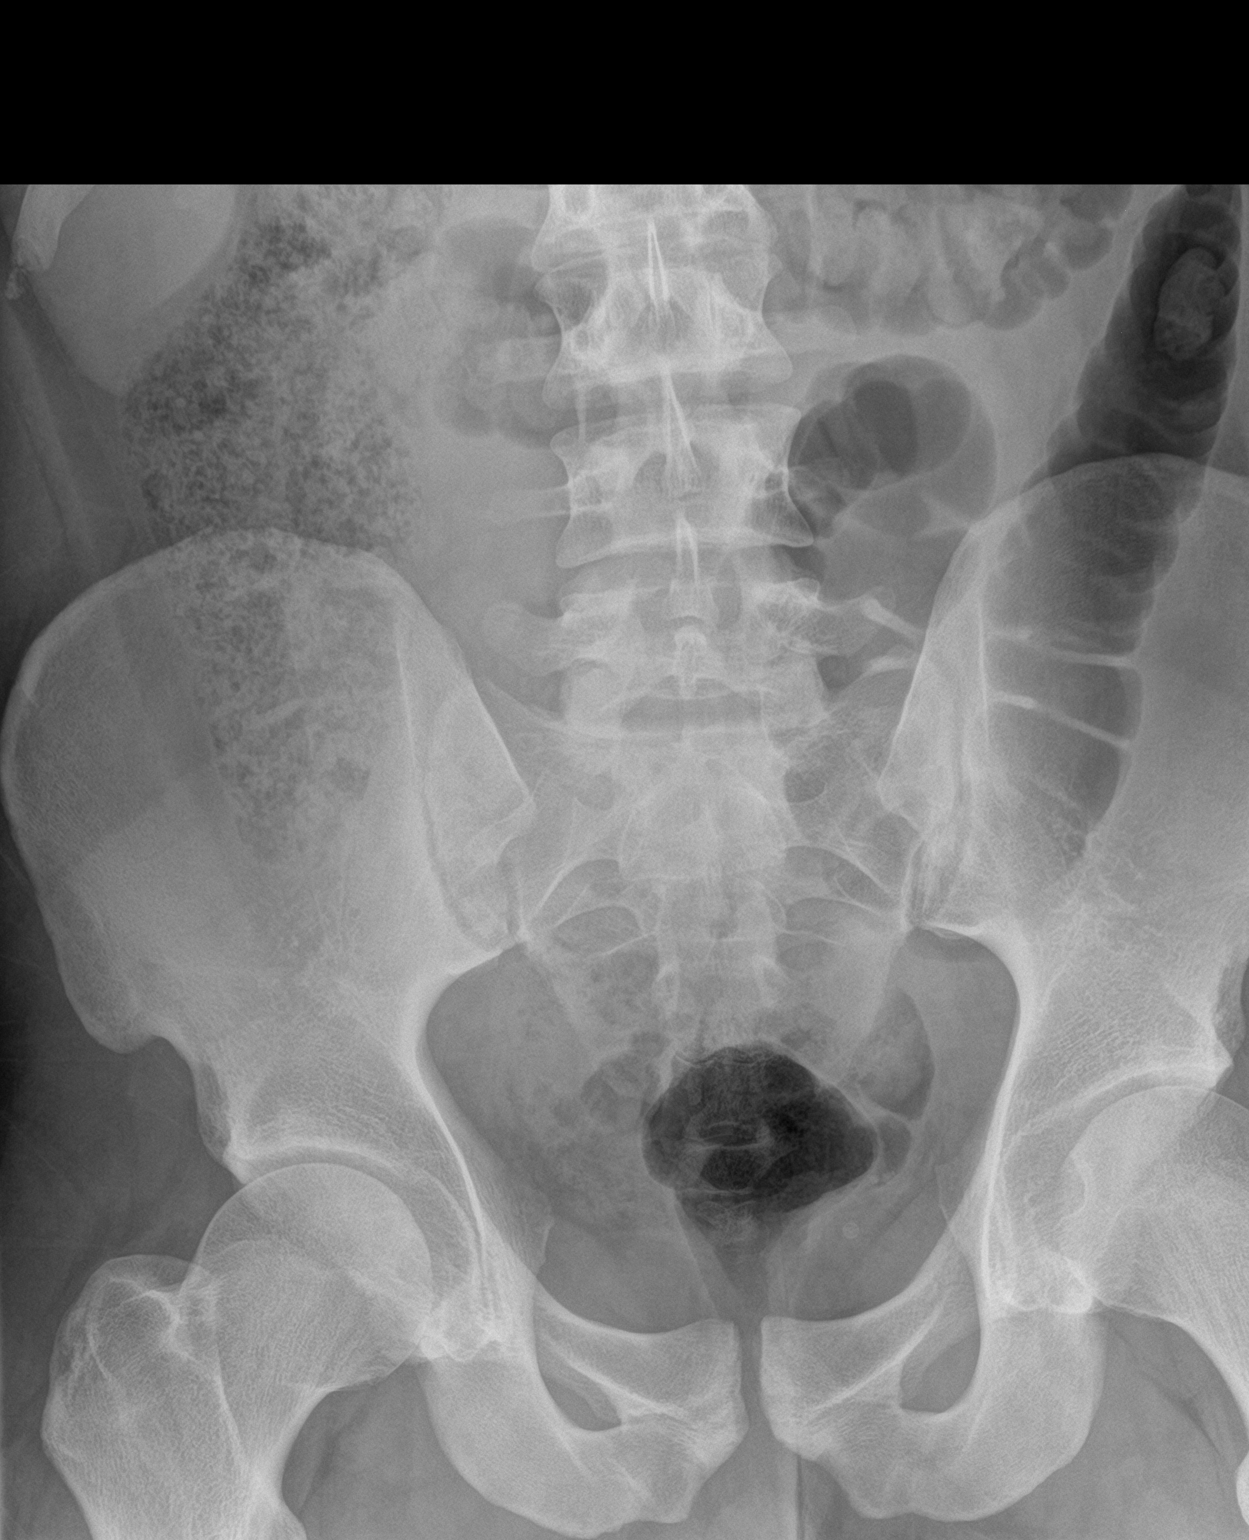

[abdomen kub (2 of 3)]
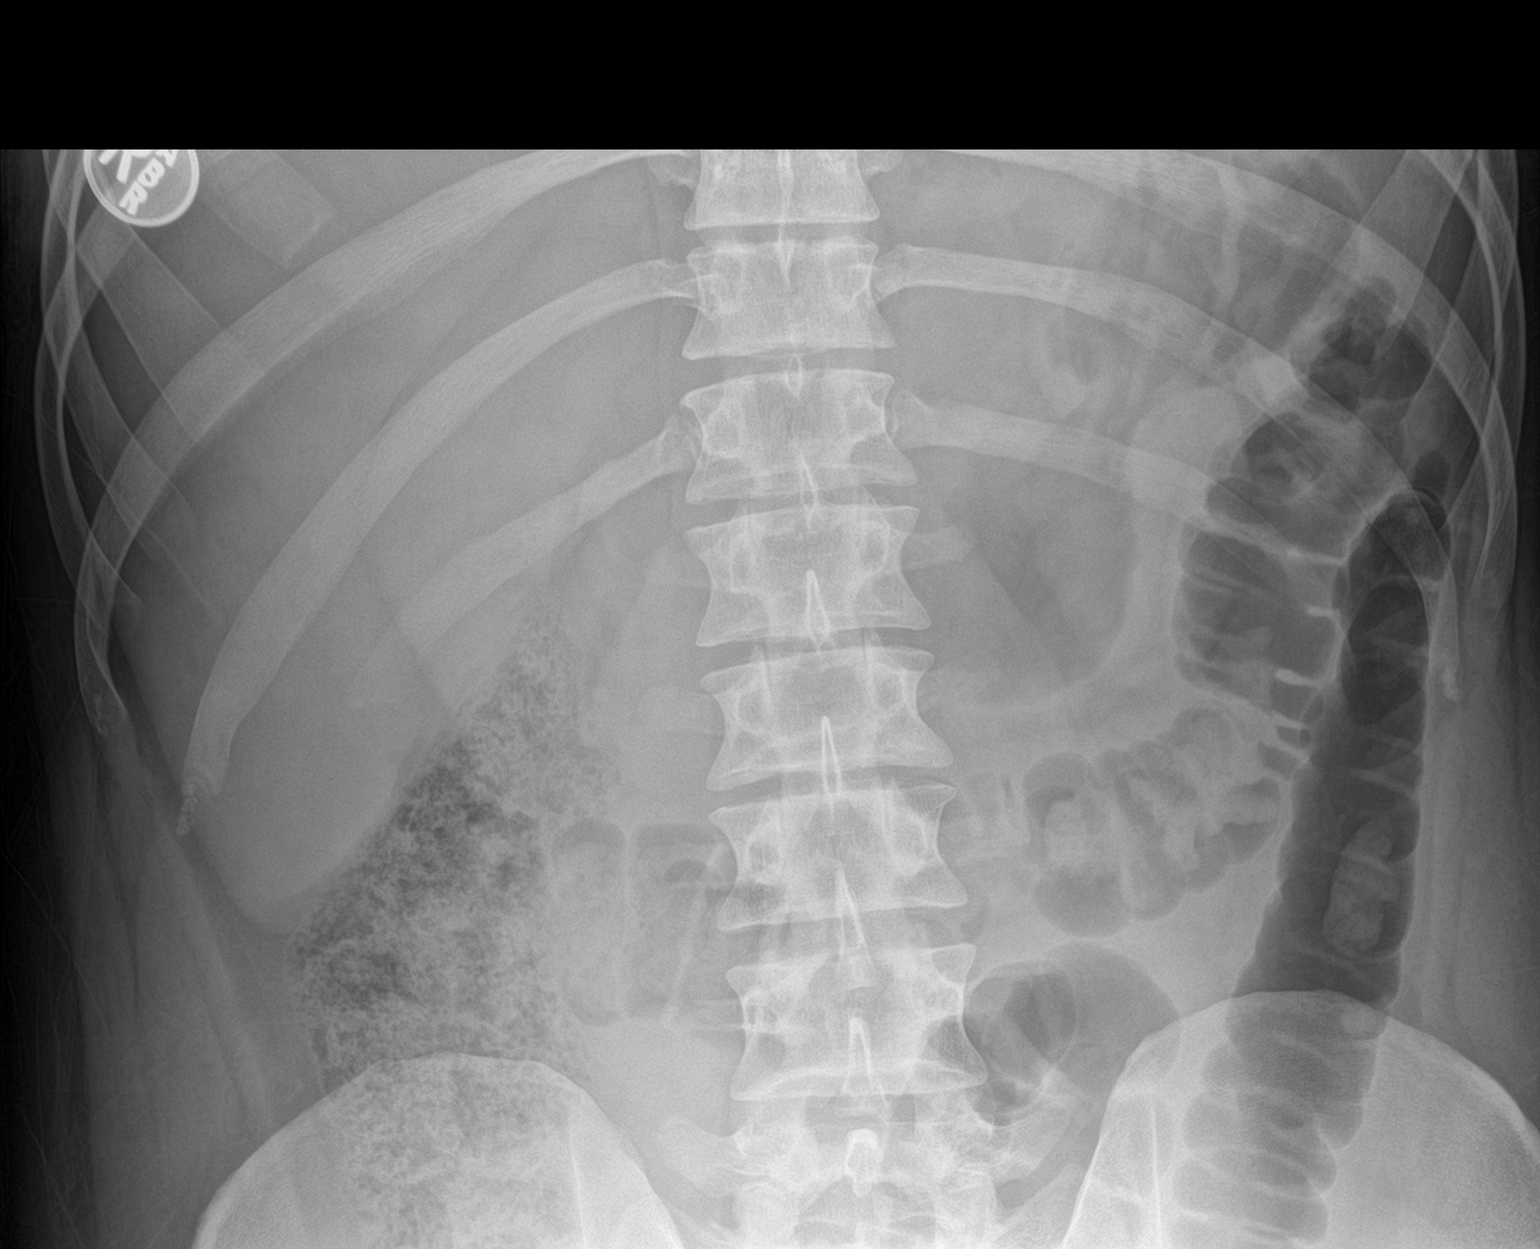

[abdomen kub (3 of 3)]
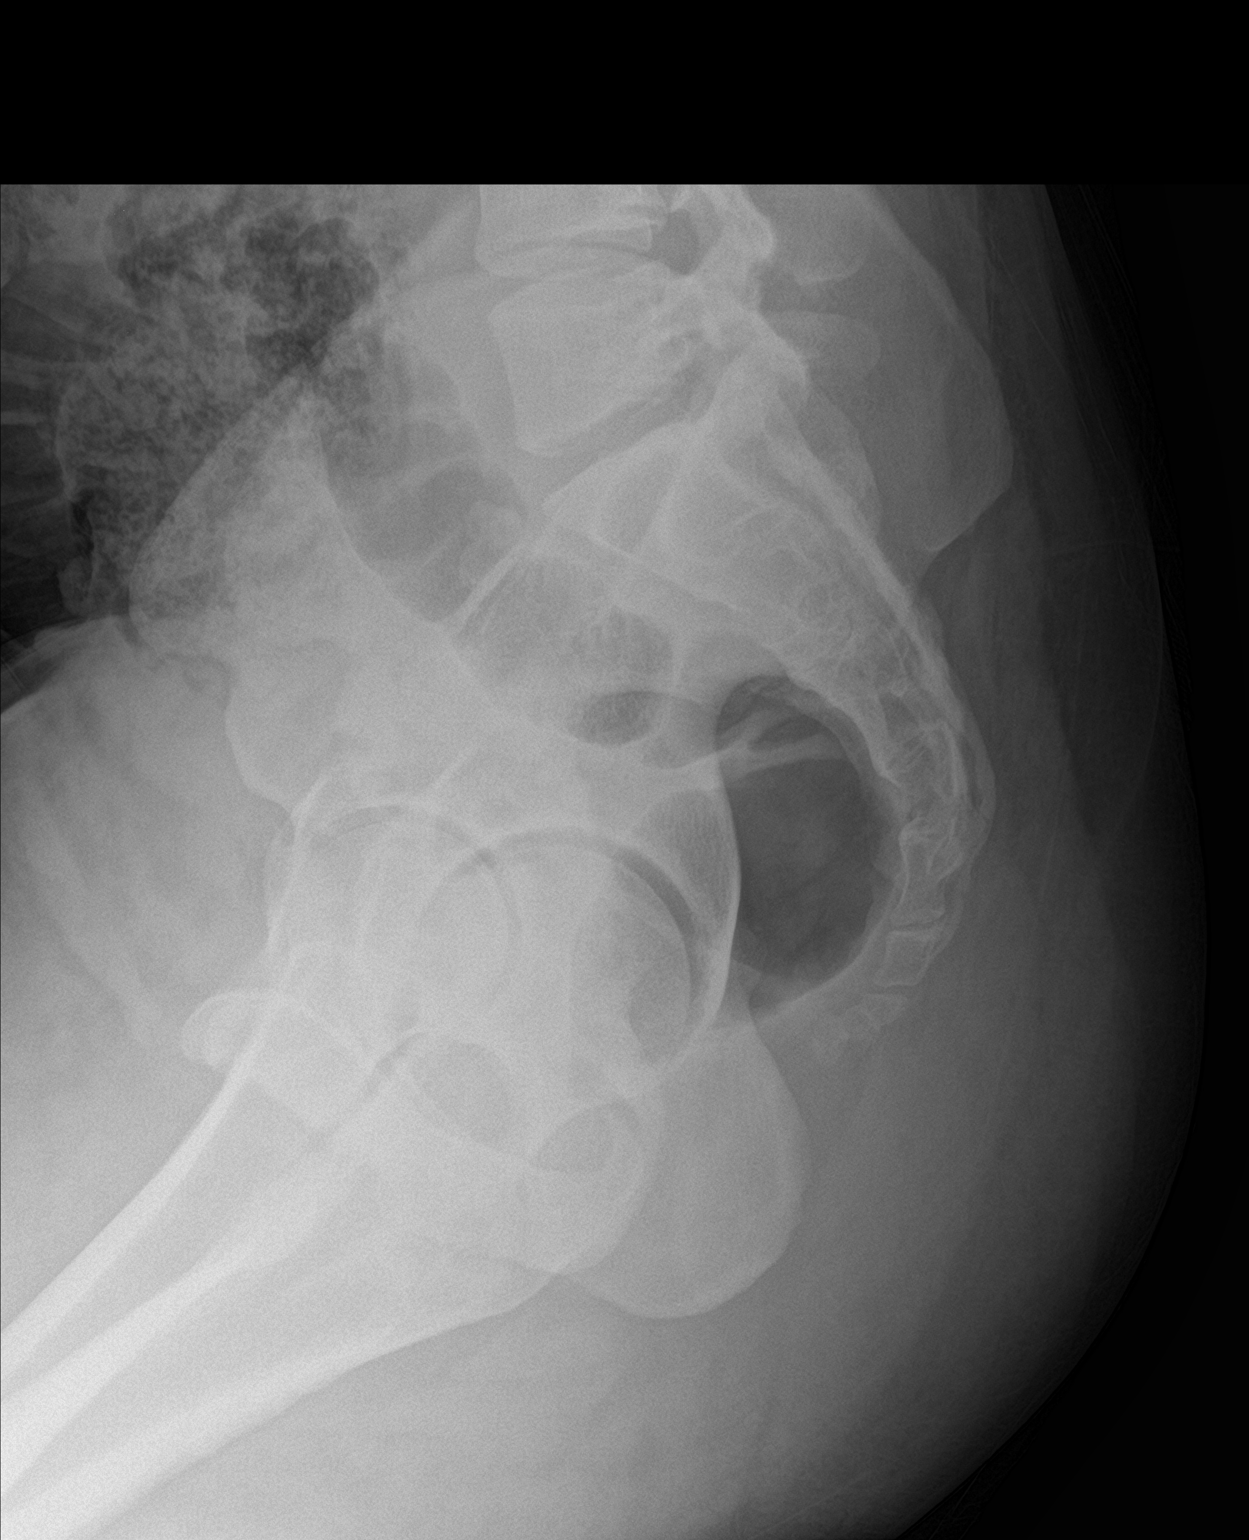

[3 of 3 positions shown; findings below may reference images not displayed]

FINDINGS: Rectal foreign body is non radio-opaque and not well visualized
radiographically. No evidence of abnormal perirectal soft tissue
air. Normal bowel gas pattern, moderate stool in the ascending and
proximal transverse colon. Air-filled descending and sigmoid colon.
No small bowel dilatation. No evidence of free air. Pelvic
phleboliths on the left.
IMPRESSION: The reported foreign body is non radio-opaque and not well
visualized on radiograph.

## 2016-08-17 IMAGING — CT CT ABD-PELV W/ CM
2 of 4 series · 16 of 46 positions shown, 18 images · IV contrast (Isovue)
Comparison: Radiograph earlier this day.  Abdominal CT [DATE]

CLINICAL DATA: Rectal foreign body, unable to palpate digitally.
Patient reports a silicone butt plug was inserted into the rectum
with sensation of fullness and tenderness.

EXAM:
CT ABDOMEN AND PELVIS WITH CONTRAST
TECHNIQUE: Multidetector CT imaging of the abdomen and pelvis was performed
using the standard protocol following bolus administration of
intravenous contrast.
CONTRAST:  100mL [MU] IOPAMIDOL ([MU]) INJECTION 61%

[Series 2: axial st · axial · 0.84mm/px · z∈[-412,+68]mm · 13 of 105 slices shown, 15 images]
[im 5/105  soft-tissue]
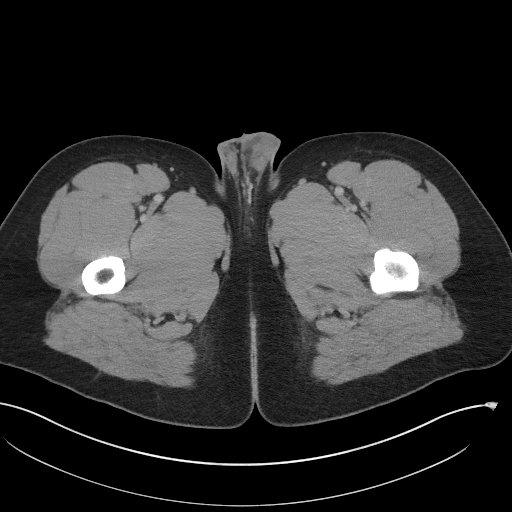
[im 5/105  bone]
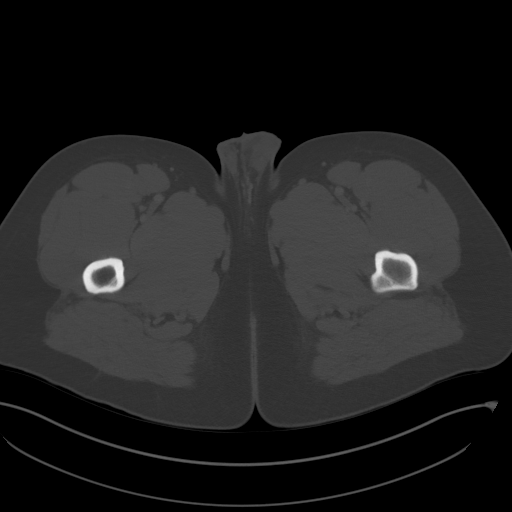
[im 13/105  soft-tissue]
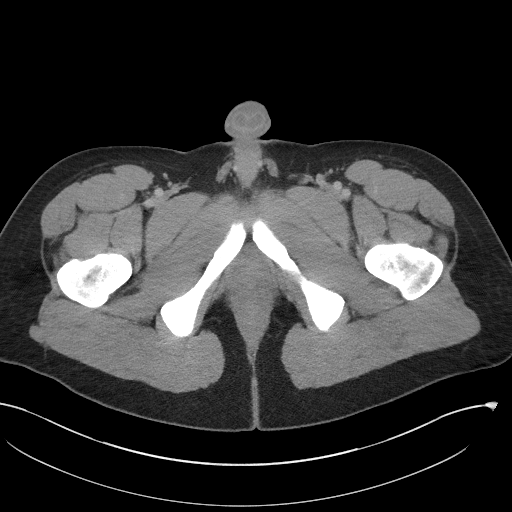
[im 21/105  soft-tissue]
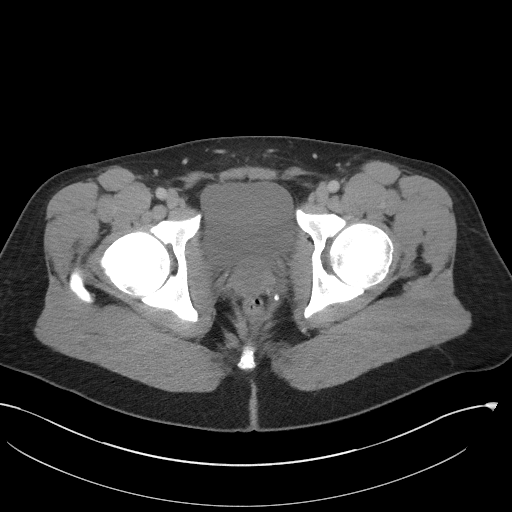
[im 29/105  soft-tissue]
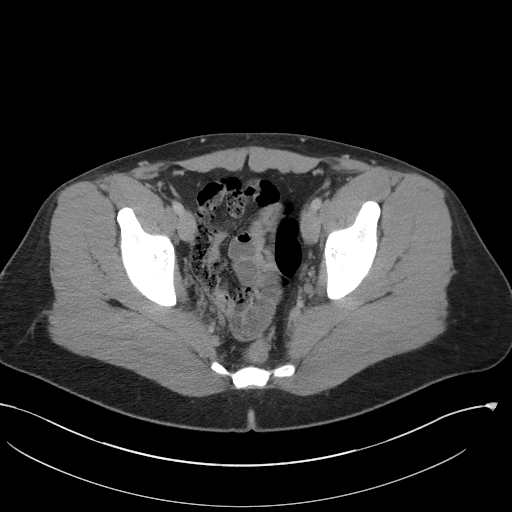
[im 37/105  soft-tissue]
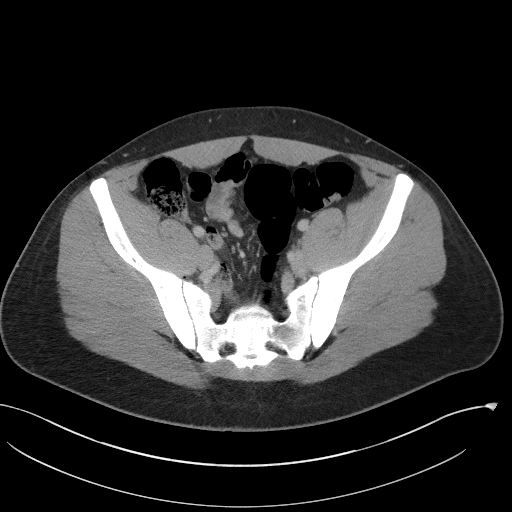
[im 45/105  soft-tissue]
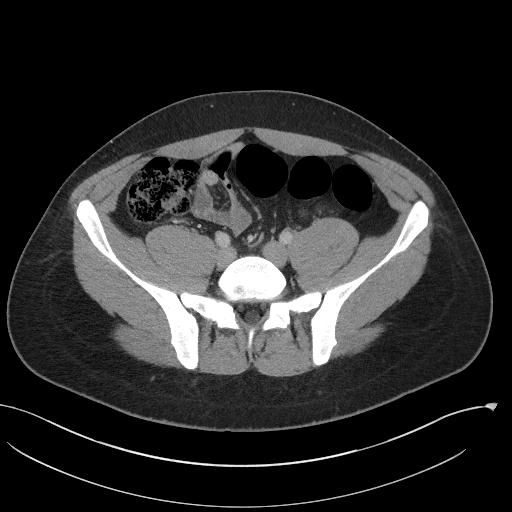
[im 53/105  soft-tissue]
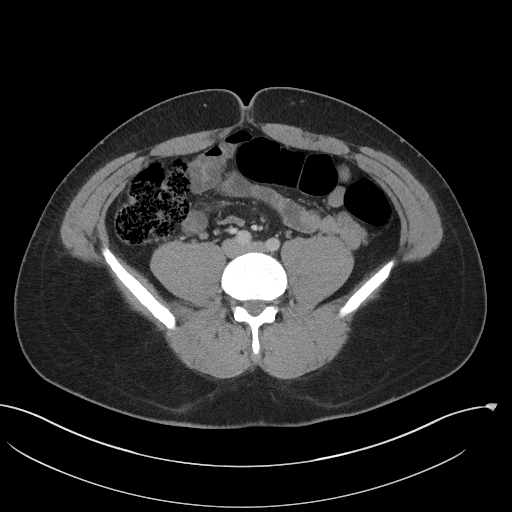
[im 61/105  soft-tissue]
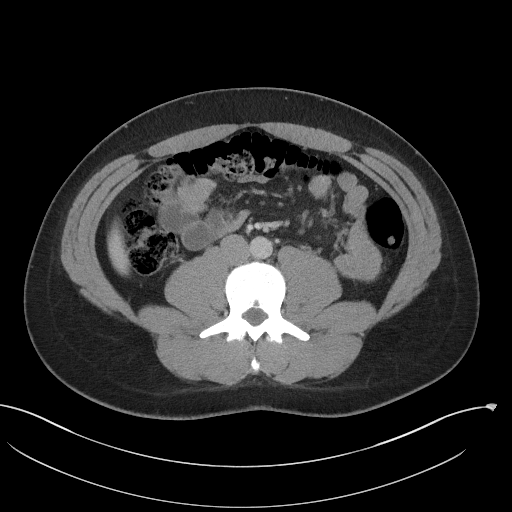
[im 69/105  soft-tissue]
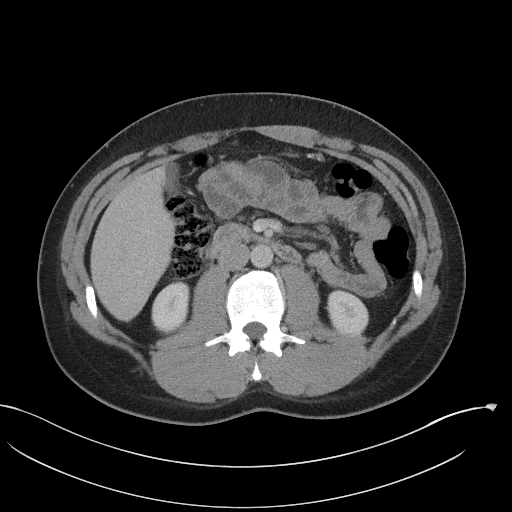
[im 69/105  bone]
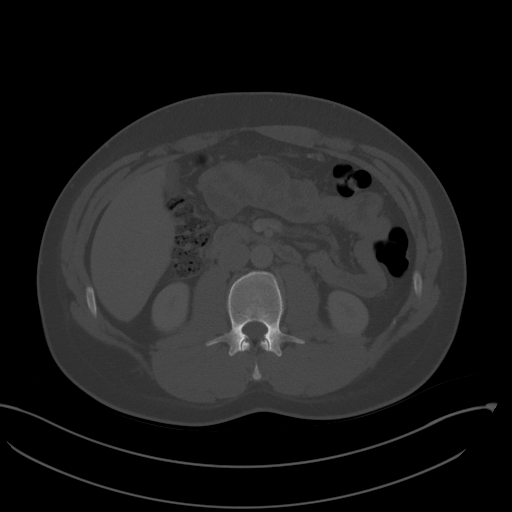
[im 77/105  soft-tissue]
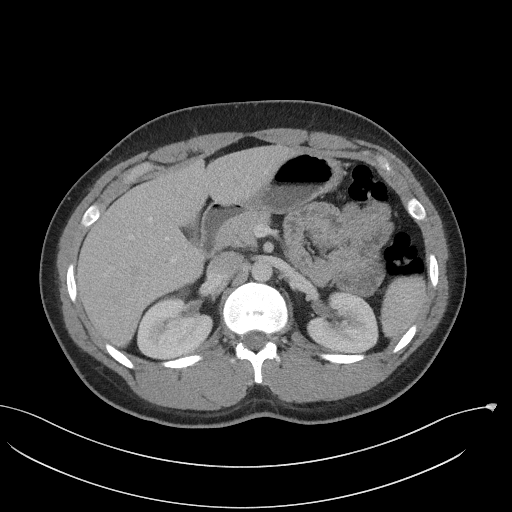
[im 85/105  soft-tissue]
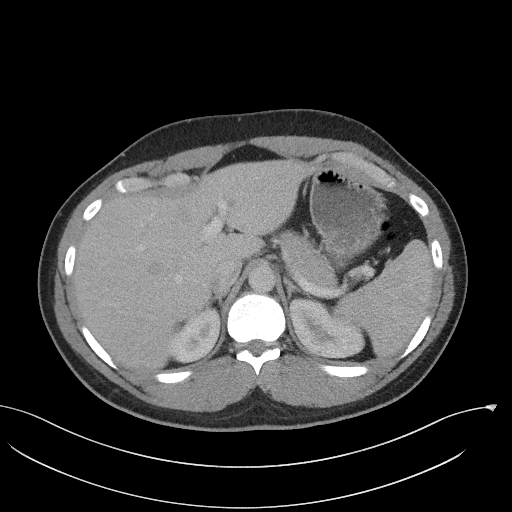
[im 93/105  soft-tissue]
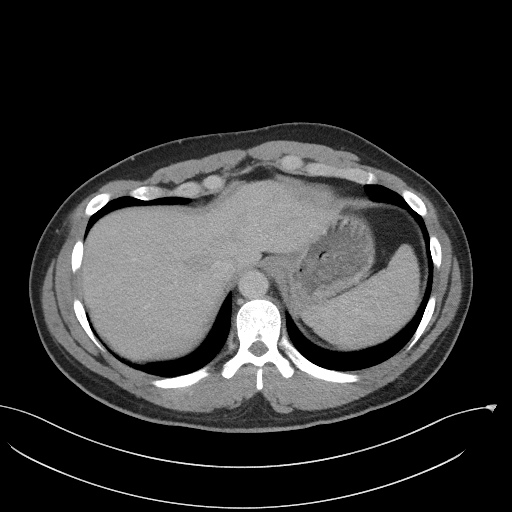
[im 101/105  soft-tissue]
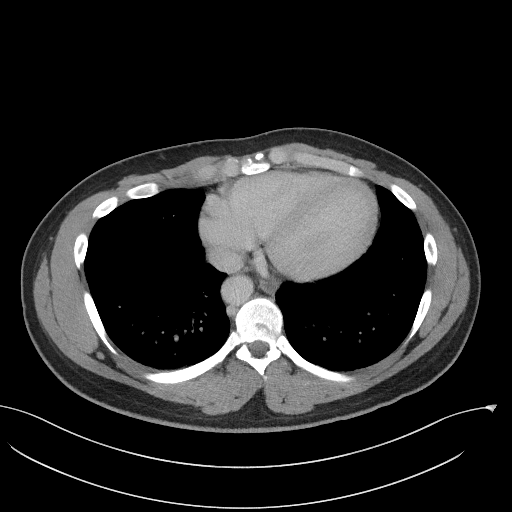

[Series 5: coronal st · coronal · 0.91mm/px · 3 of 113 slices shown]
[im 38/113  soft-tissue]
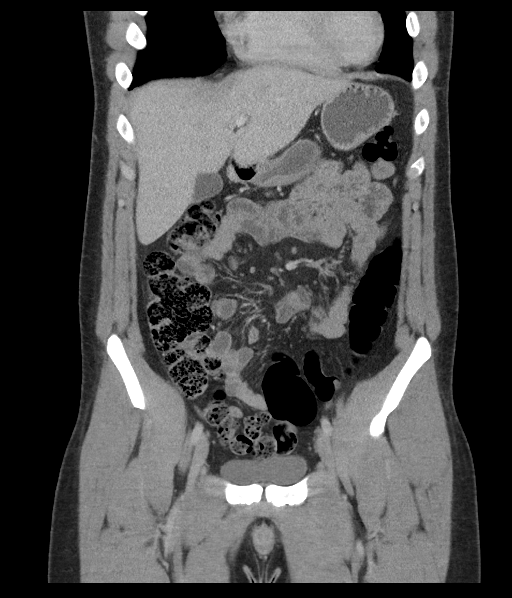
[im 50/113  soft-tissue]
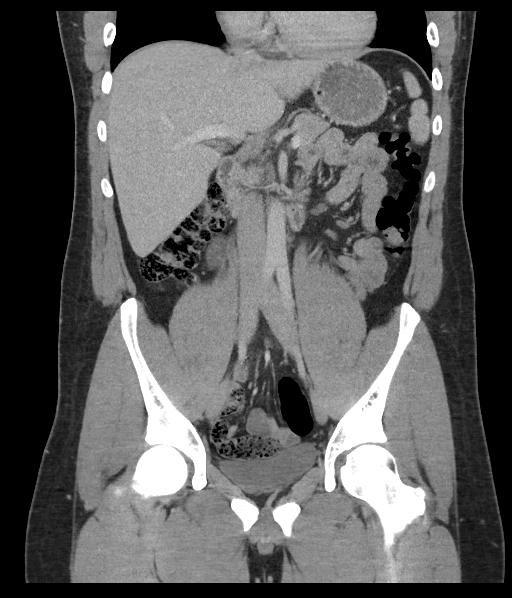
[im 63/113  soft-tissue]
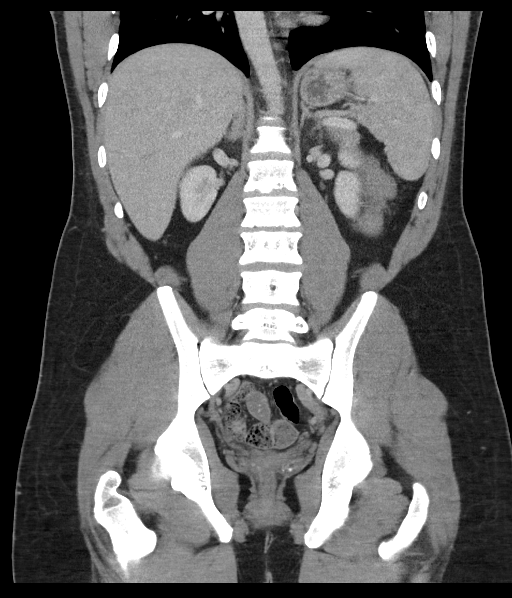

[16 of 46 positions shown; findings below may reference images not displayed]

FINDINGS: Lower chest: The lung bases are clear.

Hepatobiliary: No focal liver abnormality is seen. No gallstones,
gallbladder wall thickening, or biliary dilatation.

Pancreas: No ductal dilatation or inflammation.

Spleen: Normal in size without focal abnormality.

Adrenals/Urinary Tract: Adrenal glands are unremarkable. Kidneys are
normal, without renal calculi, focal lesion, or hydronephrosis.
Bladder is unremarkable.

Stomach/Bowel: Rectal foreign body not identified. The rectum is
decompressed without definite wall thickening. Distal sigmoid is
decompressed. More proximal sigmoid is air-filled. No foreign body
is visualized. Sigmoid is tortuous without wall thickening. Moderate
stool in the ascending and transverse colon. No evidence of
perforation, no extraluminal air or free fluid. Mild fecalization of
distal small bowel contents suggest slow transit. Normal appendix.

Vascular/Lymphatic: No significant vascular findings are present. No
enlarged abdominal or pelvic lymph nodes.

Reproductive: Prostate is unremarkable.

Other: No free fluid. No free air. No evidence of perforation or
abscess.

Musculoskeletal: There are no acute or suspicious osseous
abnormalities. Schmorl's nodes in the lumbar spine unchanged.
IMPRESSION: GI tract foreign body not identified. The rectum is decompressed.
Sigmoid colon is air-filled, no filling defects are seen. No colonic
wall thickening or evidence perforation.

## 2016-08-17 SURGERY — Surgical Case
Anesthesia: *Unknown

## 2016-08-17 SURGERY — SIGMOIDOSCOPY, FLEXIBLE
Anesthesia: Moderate Sedation

## 2016-08-17 MED ORDER — FENTANYL CITRATE (PF) 100 MCG/2ML IJ SOLN
INTRAMUSCULAR | Status: DC | PRN
  Administered 2016-08-17 (×3): 25 ug via INTRAVENOUS

## 2016-08-17 MED ORDER — FLEET ENEMA 7-19 GM/118ML RE ENEM
1.0000 | ENEMA | Freq: Once | RECTAL | Status: DC
  Filled 2016-08-17: qty 1

## 2016-08-17 MED ORDER — MIDAZOLAM HCL 5 MG/5ML IJ SOLN
INTRAMUSCULAR | Status: DC | PRN
  Administered 2016-08-17: 2 mg via INTRAVENOUS
  Administered 2016-08-17: 1 mg via INTRAVENOUS
  Administered 2016-08-17 (×2): 2 mg via INTRAVENOUS

## 2016-08-17 MED ORDER — ONDANSETRON HCL 4 MG/2ML IJ SOLN
4.0000 mg | Freq: Once | INTRAMUSCULAR | Status: AC
  Administered 2016-08-17: 4 mg via INTRAVENOUS
  Filled 2016-08-17: qty 2

## 2016-08-17 MED ORDER — STERILE WATER FOR IRRIGATION IR SOLN
Status: DC | PRN
  Administered 2016-08-17: 11:00:00

## 2016-08-17 MED ORDER — FENTANYL CITRATE (PF) 100 MCG/2ML IJ SOLN
INTRAMUSCULAR | Status: AC
  Filled 2016-08-17: qty 4

## 2016-08-17 MED ORDER — SODIUM CHLORIDE 0.9 % IV SOLN
INTRAVENOUS | Status: DC
  Administered 2016-08-17: 10:00:00 via INTRAVENOUS

## 2016-08-17 MED ORDER — MORPHINE SULFATE (PF) 4 MG/ML IV SOLN
4.0000 mg | Freq: Once | INTRAVENOUS | Status: AC
  Administered 2016-08-17: 4 mg via INTRAVENOUS
  Filled 2016-08-17: qty 1

## 2016-08-17 MED ORDER — IOPAMIDOL (ISOVUE-300) INJECTION 61%
100.0000 mL | Freq: Once | INTRAVENOUS | Status: AC | PRN
  Administered 2016-08-17: 100 mL via INTRAVENOUS

## 2016-08-17 MED ORDER — MIDAZOLAM HCL 5 MG/5ML IJ SOLN
INTRAMUSCULAR | Status: AC
  Filled 2016-08-17: qty 10

## 2016-08-17 NOTE — H&P (Signed)
Primary Care Physician:  Assunta Found, MD Primary Gastroenterologist:  Dr. Darrick Penna  Pre-Procedure History & Physical: HPI:  Aaron Martin is a 21 y.o. male here for FOREIGN BODY IN RECTUM.  Past Medical History:  Diagnosis Date  . Asthma   . Seizures (HCC)    febrile seizures    Past Surgical History:  Procedure Laterality Date  . TONSILLECTOMY      Prior to Admission medications   Medication Sig Start Date End Date Taking? Authorizing Provider  HYDROcodone-acetaminophen (NORCO/VICODIN) 5-325 MG tablet 1 or 2 tabs PO q6 hours prn pain 05/15/15   Samuel Jester, DO  naproxen (NAPROSYN) 250 MG tablet Take 1 tablet (250 mg total) by mouth 2 (two) times daily as needed for mild pain or moderate pain (take with food). 05/15/15   Samuel Jester, DO    Allergies as of 08/17/2016  . (No Known Allergies)    History reviewed. No pertinent family history.  Social History   Social History  . Marital status: Single    Spouse name: N/A  . Number of children: N/A  . Years of education: N/A   Occupational History  . Not on file.   Social History Main Topics  . Smoking status: Never Smoker  . Smokeless tobacco: Never Used  . Alcohol use Yes     Comment: occassion  . Drug use: No  . Sexual activity: Not on file   Other Topics Concern  . Not on file   Social History Narrative  . No narrative on file    Review of Systems: See HPI, otherwise negative ROS   Physical Exam: BP 122/83   Pulse (!) 59   Temp 97.9 F (36.6 C) (Oral)   Resp 10   SpO2 98%  General:   Alert,  pleasant and cooperative in NAD Head:  Normocephalic and atraumatic. Neck:  Supple; Lungs:  Clear throughout to auscultation.    Heart:  Regular rate and rhythm. Abdomen:  Soft, nontender and nondistended. Normal bowel sounds, without guarding, and without rebound.   Neurologic:  Alert and  oriented x4;  grossly normal neurologically.  Impression/Plan:    FOREIGN BODY IN RECTUM  PLAN: 1.  FLEX SIG TO REMOVE FOREIGN BODY. DISCUSSED PROCEDURE, BENEFITS, & RISKS: < 1% chance of medication reaction, bleeding, OR perforation.

## 2016-08-17 NOTE — Telephone Encounter (Signed)
Called patient TO DISCUSS has Aaron Martin pucker plug and has not passed it. NEED URGENT FLEX SIG TO REMOVE PLUG. PT INSTRUCTED TO COME TO SHORT STAY NOW FOR ADD ON FLEX SIG.

## 2016-08-17 NOTE — Progress Notes (Signed)
Please excuse Sinclair Schipp from work today and Tuesday, Aug 18, 2016 due to procedure performed at [facility].  If you have any questions please call.

## 2016-08-17 NOTE — Op Note (Addendum)
[facility] Patient Name: Aaron Martin Procedure Date: 08/17/2016 10:42 AM MRN: [id number] Date of Birth: 1995-11-01 Attending MD: Jonette Eva , MD CSN: [id number] Age: 21 Admit Type: Outpatient Procedure:                Colonoscopy, DIAGNOSTIC/INCOMPLETE TO HEPATIC                            FLEXURE Indications:              Foreign body removal from the rectum.PT REPORTS                            HAVING A PUCKER PLUG IN HIS RECTUM. I PERSONALLY                            REVIEWED CT MAY 21 WITH DR. Tyron Russell: NO FOREIGN BODY                            APPRECIATED. Providers:                Jonette Eva, MD, Criselda Peaches. Patsy Lager, RN, Burke Keels, Technician Referring MD:             Corrie Mckusick MD, MD Medicines:                Fentanyl 75 micrograms IV, Midazolam 7 mg IV Complications:            No immediate complications. Estimated Blood Loss:     Estimated blood loss: none. Procedure:                Pre-Anesthesia Assessment:                           - Prior to the procedure, a History and Physical                            was performed, and patient medications and                            allergies were reviewed. The patient's tolerance of                            previous anesthesia was also reviewed. The risks                            and benefits of the procedure and the sedation                            options and risks were discussed with the patient.                            All questions were answered, and informed consent  was obtained. Prior Anticoagulants: The patient has                            taken no previous anticoagulant or antiplatelet                            agents. ASA Grade Assessment: I - A normal, healthy                            patient. After reviewing the risks and benefits,                            the patient was deemed in satisfactory condition to        undergo the procedure.                           After obtaining informed consent, the colonoscope                            was passed under direct vision. Throughout the                            procedure, the patient's blood pressure, pulse, and                            oxygen saturations were monitored continuously. The                            [id number]-[id number] ([id number]) scope was introduced through the                            anus and advanced to the the hepatic flexure. The                            colonoscopy was somewhat difficult due to                            significant looping. Successful completion of the                            procedure was aided by increasing the dose of                            sedation medication and straightening and                            shortening the scope to obtain bowel loop                            reduction. The patient tolerated the procedure                            fairly well. The quality of the bowel preparation  was not adequate to identify polyps 6 mm and larger                            in size. The rectum was photographed. After                            obtaining informed consent, the colonoscope was                            passed under direct vision. Throughout the                            procedure, the patient's blood pressure, pulse, and                            oxygen saturations were monitored continuously. Scope In: 11:06:16 AM Scope Out: 11:16:46 AM Total Procedure Duration: 0 hours 10 minutes 30 seconds  Findings:      The recto-sigmoid colon and sigmoid colon were moderately redundant.      The exam was otherwise without abnormality. NO FOREIGN BODY SEEN IN       COLON/RECTUM FORWARD VIEWING OR DURING RETROFLEXION.      Internal hemorrhoids were found during retroflexion. The hemorrhoids       were small. Impression:               - Preparation of the colon was  inadequate.                           - No FOREIGN BODY SEEN IN COLON OR RECTUM Moderate Sedation:      Moderate (conscious) sedation was administered by the endoscopy nurse       and supervised by the endoscopist. The following parameters were       monitored: oxygen saturation, heart rate, blood pressure, and response       to care. Total physician intraservice time was 21 minutes. Recommendation:           - Resume previous diet.                           - Continue present medications.                           - Patient has a contact number available for                            emergencies. The signs and symptoms of potential                            delayed complications were discussed with the                            patient. Return to normal activities tomorrow.                            Written discharge instructions were provided to the  patient.                           - Repeat colonoscopy at age 32 for surveillance. Procedure Code(s):        --- Professional ---                           [phone], 53, Colonoscopy, flexible; diagnostic,                            including collection of specimen(s) by brushing or                            washing, when performed (separate procedure)                           99152, Moderate sedation services provided by the                            same physician or other qualified health care                            professional performing the diagnostic or                            therapeutic service that the sedation supports,                            requiring the presence of an independent trained                            observer to assist in the monitoring of the                            patient's level of consciousness and physiological                            status; initial 15 minutes of intraservice time,                            patient age 45 years or older Diagnosis Code(s):         --- Professional ---                           W11.9JYN, Foreign body in anus and rectum, initial                            encounter CPT copyright 2016 [employer]. All rights reserved. The codes documented in this report are preliminary and upon coder review may  be revised to meet current compliance requirements. Jonette Eva, MD Jonette Eva, MD 08/17/2016 11:43:20 AM This report has been signed electronically. Number of Addenda: 0

## 2016-08-17 NOTE — Telephone Encounter (Signed)
Pt was told to call the office at 0800 to schedule getting a foreign object out of his rectum. Do we need to bring him in for OV first or is this something we need to schedule at Short Stay? I told the patient that I would call him back. [phone] Please advise

## 2016-08-17 NOTE — Discharge Instructions (Signed)
You have small internal hemorrhoids.   DRINK WATER TO KEEP YOUR URINE LIGHT YELLOW.  FOLLOW A HIGH FIBER DIET. AVOID ITEMS THAT CAUSE BLOATING & GAS. SEE INFO BELOW.  Next colonoscopy in 5-10 years.    Colonoscopy Care After Read the instructions outlined below and refer to this sheet in the next week. These discharge instructions provide you with general information on caring for yourself after you leave the hospital. While your treatment has been planned according to the most current medical practices available, unavoidable complications occasionally occur. If you have any problems or questions after discharge, call DR. Nain Rudd, [phone].  ACTIVITY  You may resume your regular activity, but move at a slower pace for the next 24 hours.   Take frequent rest periods for the next 24 hours.   Walking will help get rid of the air and reduce the bloated feeling in your belly (abdomen).   No driving for 24 hours (because of the medicine (anesthesia) used during the test).   You may shower.   Do not sign any important legal documents or operate any machinery for 24 hours (because of the anesthesia used during the test).    NUTRITION  Drink plenty of fluids.   You may resume your normal diet as instructed by your doctor.   Begin with a light meal and progress to your normal diet. Heavy or fried foods are harder to digest and may make you feel sick to your stomach (nauseated).   Avoid alcoholic beverages for 24 hours or as instructed.    MEDICATIONS  You may resume your normal medications.   WHAT YOU CAN EXPECT TODAY  Some feelings of bloating in the abdomen.   Passage of more gas than usual.   Spotting of blood in your stool or on the toilet paper  .  IF YOU HAD POLYPS REMOVED DURING THE COLONOSCOPY:  Eat a soft diet IF YOU HAVE NAUSEA, BLOATING, ABDOMINAL PAIN, OR VOMITING.    FINDING OUT THE RESULTS OF YOUR TEST Not all test results are available during your  visit. DR. Darrick Penna WILL CALL YOU WITHIN 14 DAYS OF YOUR PROCEDUE WITH YOUR RESULTS. Do not assume everything is normal if you have not heard from DR. Marte Celani, CALL HER OFFICE AT [phone].  SEEK IMMEDIATE MEDICAL ATTENTION AND CALL THE OFFICE: [phone] IF:  You have more than a spotting of blood in your stool.   Your belly is swollen (abdominal distention).   You are nauseated or vomiting.   You have a temperature over 101F.   You have abdominal pain or discomfort that is severe or gets worse throughout the day.   High-Fiber Diet A high-fiber diet changes your normal diet to include more whole grains, legumes, fruits, and vegetables. Changes in the diet involve replacing refined carbohydrates with unrefined foods. The calorie level of the diet is essentially unchanged. The Dietary Reference Intake (recommended amount) for adult males is 38 grams per day. For adult females, it is 25 grams per day. Pregnant and lactating women should consume 28 grams of fiber per day. Fiber is the intact part of a plant that is not broken down during digestion. Functional fiber is fiber that has been isolated from the plant to provide a beneficial effect in the body. PURPOSE  Increase stool bulk.   Ease and regulate bowel movements.   Lower cholesterol.   REDUCE RISK OF COLON CANCER  INDICATIONS THAT YOU NEED MORE FIBER  Constipation and hemorrhoids.   Uncomplicated diverticulosis (intestine  condition) and irritable bowel syndrome.   Weight management.   As a protective measure against hardening of the arteries (atherosclerosis), diabetes, and cancer.   GUIDELINES FOR INCREASING FIBER IN THE DIET  Start adding fiber to the diet slowly. A gradual increase of about 5 more grams (2 slices of whole-wheat bread, 2 servings of most fruits or vegetables, or 1 bowl of high-fiber cereal) per day is best. Too rapid an increase in fiber may result in constipation, flatulence, and bloating.   Drink  enough water and fluids to keep your urine clear or pale yellow. Water, juice, or caffeine-free drinks are recommended. Not drinking enough fluid may cause constipation.   Eat a variety of high-fiber foods rather than one type of fiber.   Try to increase your intake of fiber through using high-fiber foods rather than fiber pills or supplements that contain small amounts of fiber.   The goal is to change the types of food eaten. Do not supplement your present diet with high-fiber foods, but replace foods in your present diet.   INCLUDE A VARIETY OF FIBER SOURCES  Replace refined and processed grains with whole grains, canned fruits with fresh fruits, and incorporate other fiber sources. White rice, white breads, and most bakery goods contain little or no fiber.   Brown whole-grain rice, buckwheat oats, and many fruits and vegetables are all good sources of fiber. These include: broccoli, Brussels sprouts, cabbage, cauliflower, beets, sweet potatoes, white potatoes (skin on), carrots, tomatoes, eggplant, squash, berries, fresh fruits, and dried fruits.   Cereals appear to be the richest source of fiber. Cereal fiber is found in whole grains and bran. Bran is the fiber-rich outer coat of cereal grain, which is largely removed in refining. In whole-grain cereals, the bran remains. In breakfast cereals, the largest amount of fiber is found in those with "bran" in their names. The fiber content is sometimes indicated on the label.   You may need to include additional fruits and vegetables each day.   In baking, for 1 cup white flour, you may use the following substitutions:   1 cup whole-wheat flour minus 2 tablespoons.   1/2 cup white flour plus 1/2 cup whole-wheat flour.    Hemorrhoids Hemorrhoids are dilated (enlarged) veins around the rectum. Sometimes clots will form in the veins. This makes them swollen and painful. These are called thrombosed hemorrhoids. Causes of hemorrhoids  include:  Constipation.   Straining to have a bowel movement.   HEAVY LIFTING  HOME CARE INSTRUCTIONS  Eat a well balanced diet and drink 6 to 8 glasses of water every day to avoid constipation. You may also use a bulk laxative.   Avoid straining to have bowel movements.   Keep anal area dry and clean.   Do not use a donut shaped pillow or sit on the toilet for long periods. This increases blood pooling and pain.   Move your bowels when your body has the urge; this will require less straining and will decrease pain and pressure.

## 2016-08-17 NOTE — ED Triage Notes (Addendum)
Pt reports having a silicone "butt plug" in his rectum. Pt reports a small amt of rectal bleeding and lower abdominal pain.

## 2016-08-17 NOTE — Discharge Instructions (Signed)
Take 1 dose of MIRALAX. DO NOT eat or drink besides this dose. If you are able to have a bowel movement and have this plug, out, then you do not need any further treatment or care. If it does not come out then you need to call the gastroenterologist, Dr. Darrick Penna, at 8 AM and they will see you in the office to remove it. If you develop any abdominal pain, severe pain, vomiting, or other concerning symptoms, return to the ER

## 2016-08-17 NOTE — ED Provider Notes (Signed)
[facility]-EMERGENCY DEPT Provider Note   CSN: [id number] Arrival date & time: 08/17/16  0146     History   Chief Complaint Chief Complaint  Patient presents with  . Foreign Body in Rectum    HPI Aaron Martin is a 21 y.o. male.  HPI  21 year old male presents with a retained foreign body in his rectum. At about 1245 he was using a silicone butt plug and states it has not come out. He tried squatting and trying to go to the bathroom but it has remained stuck. He could not palpated digitally when he stuck his finger in his rectum. He had very mild blood on his fingers after doing this. He has rectal discomfort and feels some lower abdominal bloating but no significant abdominal pain. Felt a little nauseated earlier.  Past Medical History:  Diagnosis Date  . Asthma   . Seizures (HCC)    febrile seizures    Patient Active Problem List   Diagnosis Date Noted  . Foreign body in anus and rectum     Past Surgical History:  Procedure Laterality Date  . TONSILLECTOMY         Home Medications    Prior to Admission medications   Not on File    Family History History reviewed. No pertinent family history.  Social History Social History  Substance Use Topics  . Smoking status: Never Smoker  . Smokeless tobacco: Never Used  . Alcohol use Yes     Comment: occassion     Allergies   Patient has no known allergies.   Review of Systems Review of Systems  Gastrointestinal: Positive for abdominal pain (bloating more than pain), nausea and rectal pain. Negative for blood in stool.  All other systems reviewed and are negative.    Physical Exam Updated Vital Signs BP 123/64 (BP Location: Left Arm)   Pulse 76   Temp 97.9 F (36.6 C) (Oral)   Resp 18   Ht 6' (1.829 m)   Wt 111.1 kg (245 lb)   SpO2 98%   BMI 33.23 kg/m   Physical Exam  Constitutional: He is oriented to person, place, and time. He appears well-developed and well-nourished.  HENT:  Head:  Normocephalic and atraumatic.  Right Ear: External ear normal.  Left Ear: External ear normal.  Nose: Nose normal.  Eyes: Right eye exhibits no discharge. Left eye exhibits no discharge.  Neck: Neck supple.  Cardiovascular: Normal rate, regular rhythm and normal heart sounds.   Pulmonary/Chest: Effort normal and breath sounds normal.  Abdominal: Soft. He exhibits no distension. There is no tenderness.  Genitourinary:  Genitourinary Comments: Difficult exam as he was tender but no fluctuance, masses or FB. No gross blood on digiti al exam  Musculoskeletal: He exhibits no edema.  Neurological: He is alert and oriented to person, place, and time.  Skin: Skin is warm and dry. He is not diaphoretic.  Nursing note and vitals reviewed.    ED Treatments / Results  Labs (all labs ordered are listed, but only abnormal results are displayed) Labs Reviewed  COMPREHENSIVE METABOLIC PANEL - Abnormal; Notable for the following:       Result Value   Glucose, Bld 100 (*)    All other components within normal limits  CBC WITH DIFFERENTIAL/PLATELET    EKG  EKG Interpretation None       Radiology Dg Abdomen 1 View  Result Date: 08/17/2016 CLINICAL DATA:  Foreign body in rectum. EXAM: ABDOMEN - 1 VIEW COMPARISON:  None. FINDINGS: Rectal foreign body is non radio-opaque and not well visualized radiographically. No evidence of abnormal perirectal soft tissue air. Normal bowel gas pattern, moderate stool in the ascending and proximal transverse colon. Air-filled descending and sigmoid colon. No small bowel dilatation. No evidence of free air. Pelvic phleboliths on the left. IMPRESSION: The reported foreign body is non radio-opaque and not well visualized on radiograph. Electronically Signed   By: Rubye Oaks M.D.   On: 08/17/2016 02:51   Ct Abdomen Pelvis W Contrast  Result Date: 08/17/2016 CLINICAL DATA:  Rectal foreign body, unable to palpate digitally. Patient reports a silicone butt plug  was inserted into the rectum with sensation of fullness and tenderness. EXAM: CT ABDOMEN AND PELVIS WITH CONTRAST TECHNIQUE: Multidetector CT imaging of the abdomen and pelvis was performed using the standard protocol following bolus administration of intravenous contrast. CONTRAST:  ISOVUE-300 IOPAMIDOL (ISOVUE-300) INJECTION 61% COMPARISON:  Radiograph earlier this day.  Abdominal CT 05/15/2015 FINDINGS: Lower chest: The lung bases are clear. Hepatobiliary: No focal liver abnormality is seen. No gallstones, gallbladder wall thickening, or biliary dilatation. Pancreas: No ductal dilatation or inflammation. Spleen: Normal in size without focal abnormality. Adrenals/Urinary Tract: Adrenal glands are unremarkable. Kidneys are normal, without renal calculi, focal lesion, or hydronephrosis. Bladder is unremarkable. Stomach/Bowel: Rectal foreign body not identified. The rectum is decompressed without definite wall thickening. Distal sigmoid is decompressed. More proximal sigmoid is air-filled. No foreign body is visualized. Sigmoid is tortuous without wall thickening. Moderate stool in the ascending and transverse colon. No evidence of perforation, no extraluminal air or free fluid. Mild fecalization of distal small bowel contents suggest slow transit. Normal appendix. Vascular/Lymphatic: No significant vascular findings are present. No enlarged abdominal or pelvic lymph nodes. Reproductive: Prostate is unremarkable. Other: No free fluid. No free air. No evidence of perforation or abscess. Musculoskeletal: There are no acute or suspicious osseous abnormalities. Schmorl's nodes in the lumbar spine unchanged. IMPRESSION: GI tract foreign body not identified. The rectum is decompressed. Sigmoid colon is air-filled, no filling defects are seen. No colonic wall thickening or evidence perforation. Electronically Signed   By: Rubye Oaks M.D.   On: 08/17/2016 04:19    Procedures Procedures (including critical  care time)  Medications Ordered in ED Medications  morphine 4 MG/ML injection 4 mg (4 mg Intravenous Given 08/17/16 0335)  ondansetron (ZOFRAN) injection 4 mg (4 mg Intravenous Given 08/17/16 0335)  iopamidol (ISOVUE-300) 61 % injection 100 mL (100 mLs Intravenous Contrast Given 08/17/16 0340)     Initial Impression / Assessment and Plan / ED Course  I have reviewed the triage vital signs and the nursing notes.  Pertinent labs & imaging results that were available during my care of the patient were reviewed by me and considered in my medical decision making (see chart for details).     Patient overall appears well. After initial Xray and CT scan, I discussed possibility of the FB having already comes out but he is adamant it is still stuck. CT without signs of FB or perf. It's possible based on the material it's not seen on CT, also has no secondary signs of blockage. D/w GI, Dr. Darrick Penna, recommends miralax x 1, d/c home, and if no FB has come out by 8 AM, call her office and be seen that AM for removal. Patient understands to be NPO as well as return precautions  Final Clinical Impressions(s) / ED Diagnoses   Final diagnoses:  Rectal foreign body, initial encounter  New Prescriptions Discharge Medication List as of 08/17/2016  4:32 AM       Pricilla Loveless, MD 08/17/16 1324

## 2016-08-17 NOTE — Telephone Encounter (Signed)
Orders are in

## 2016-08-25 ENCOUNTER — Encounter (HOSPITAL_COMMUNITY): Payer: Self-pay | Admitting: Gastroenterology

## 2016-10-06 DIAGNOSIS — L089 Local infection of the skin and subcutaneous tissue, unspecified: Secondary | ICD-10-CM | POA: Diagnosis not present

## 2017-02-27 ENCOUNTER — Emergency Department (HOSPITAL_COMMUNITY)
Admission: EM | Admit: 2017-02-27 | Discharge: 2017-02-28 | Disposition: A | Discharge status: Home / Self Care | Payer: 59 | Attending: Emergency Medicine | Admitting: Emergency Medicine

## 2017-02-27 ENCOUNTER — Emergency Department (HOSPITAL_COMMUNITY): Payer: 59

## 2017-02-27 ENCOUNTER — Encounter (HOSPITAL_COMMUNITY): Payer: Self-pay | Admitting: *Deleted

## 2017-02-27 DIAGNOSIS — J209 Acute bronchitis, unspecified: Secondary | ICD-10-CM | POA: Diagnosis not present

## 2017-02-27 DIAGNOSIS — R05 Cough: Secondary | ICD-10-CM | POA: Diagnosis not present

## 2017-02-27 DIAGNOSIS — J45909 Unspecified asthma, uncomplicated: Secondary | ICD-10-CM | POA: Diagnosis not present

## 2017-02-27 IMAGING — DX DG CHEST 2V
2 series · 2 of 2 positions shown · non-contrast
Comparison: [DATE]

CLINICAL DATA: Productive cough

EXAM:
CHEST  2 VIEW

[chest pa]
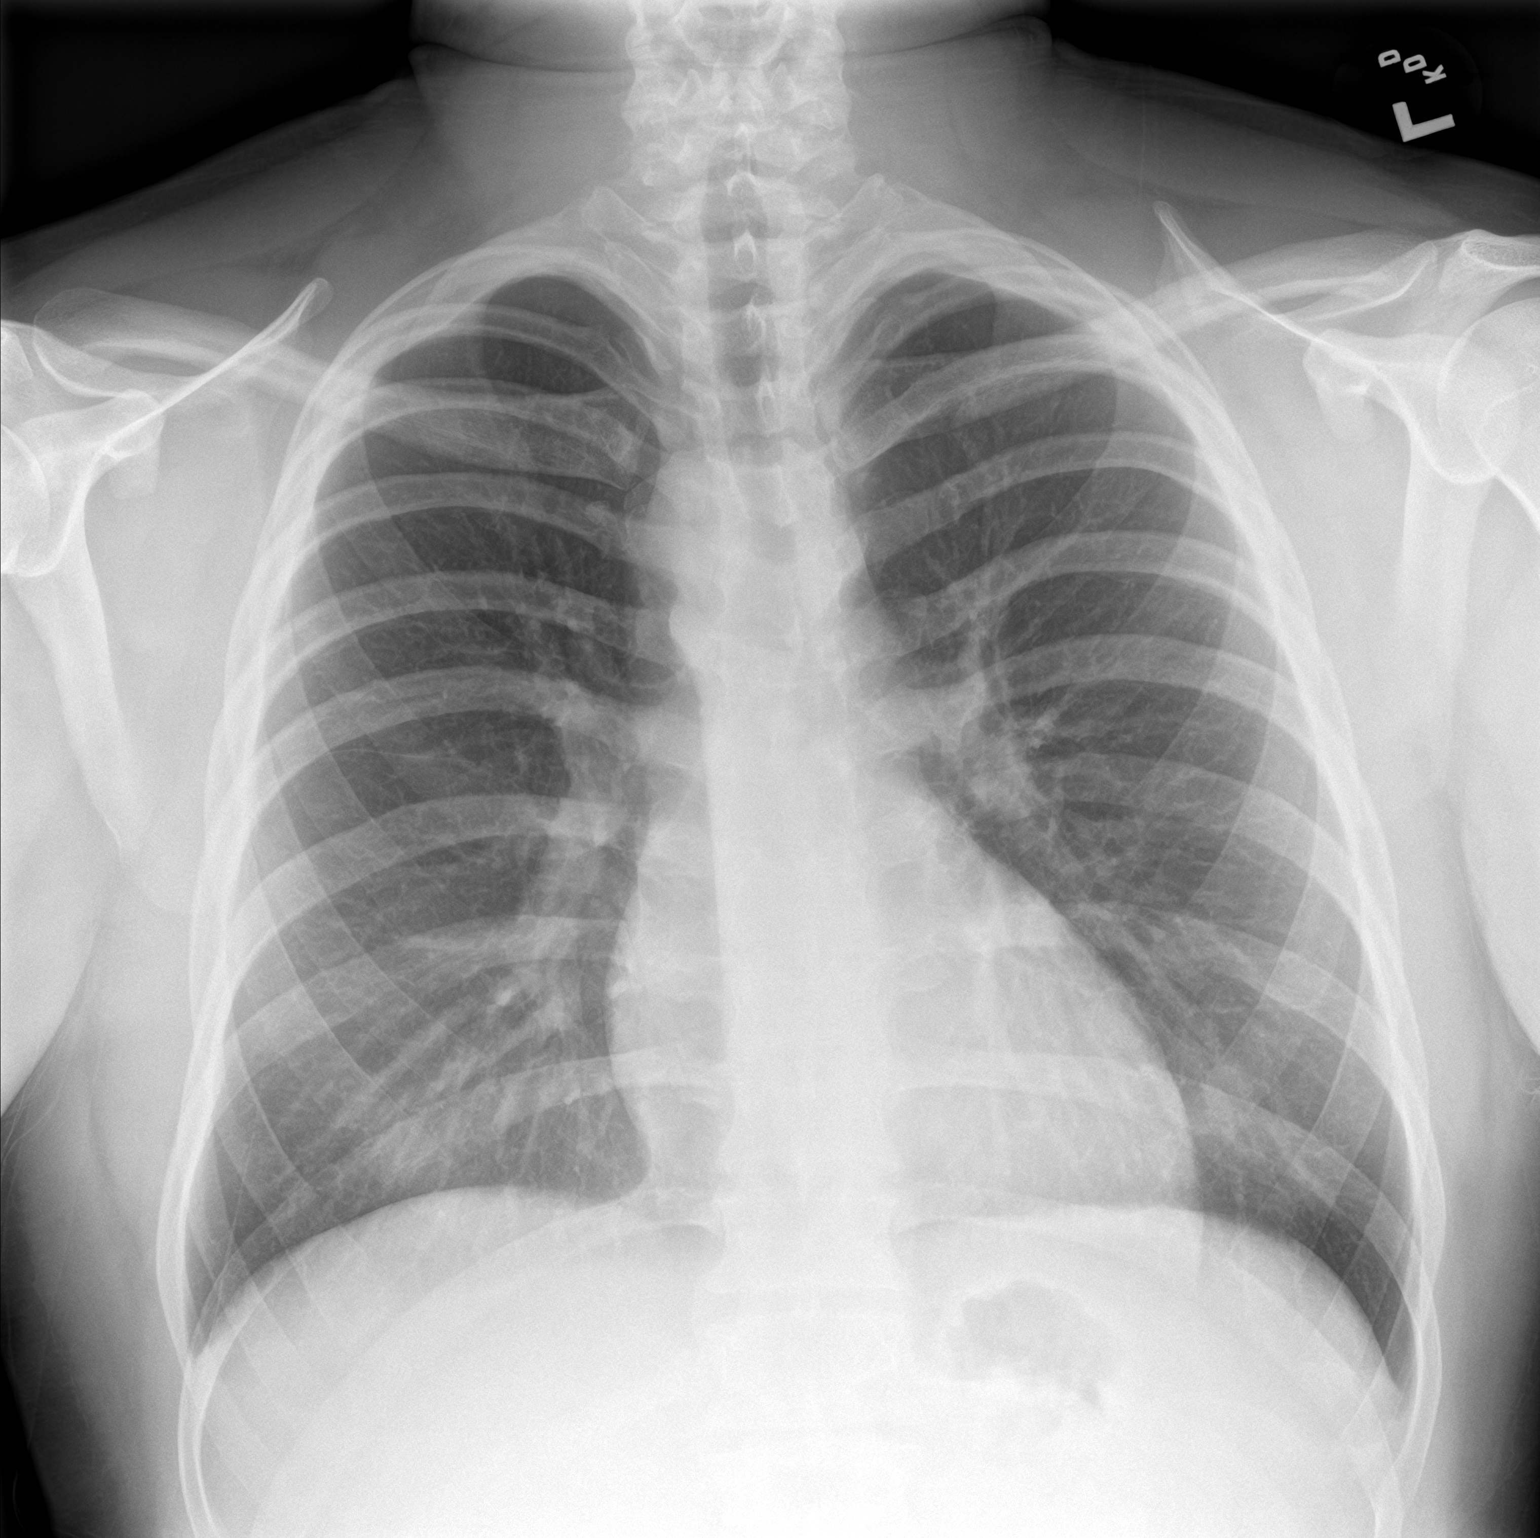

[chest lat]
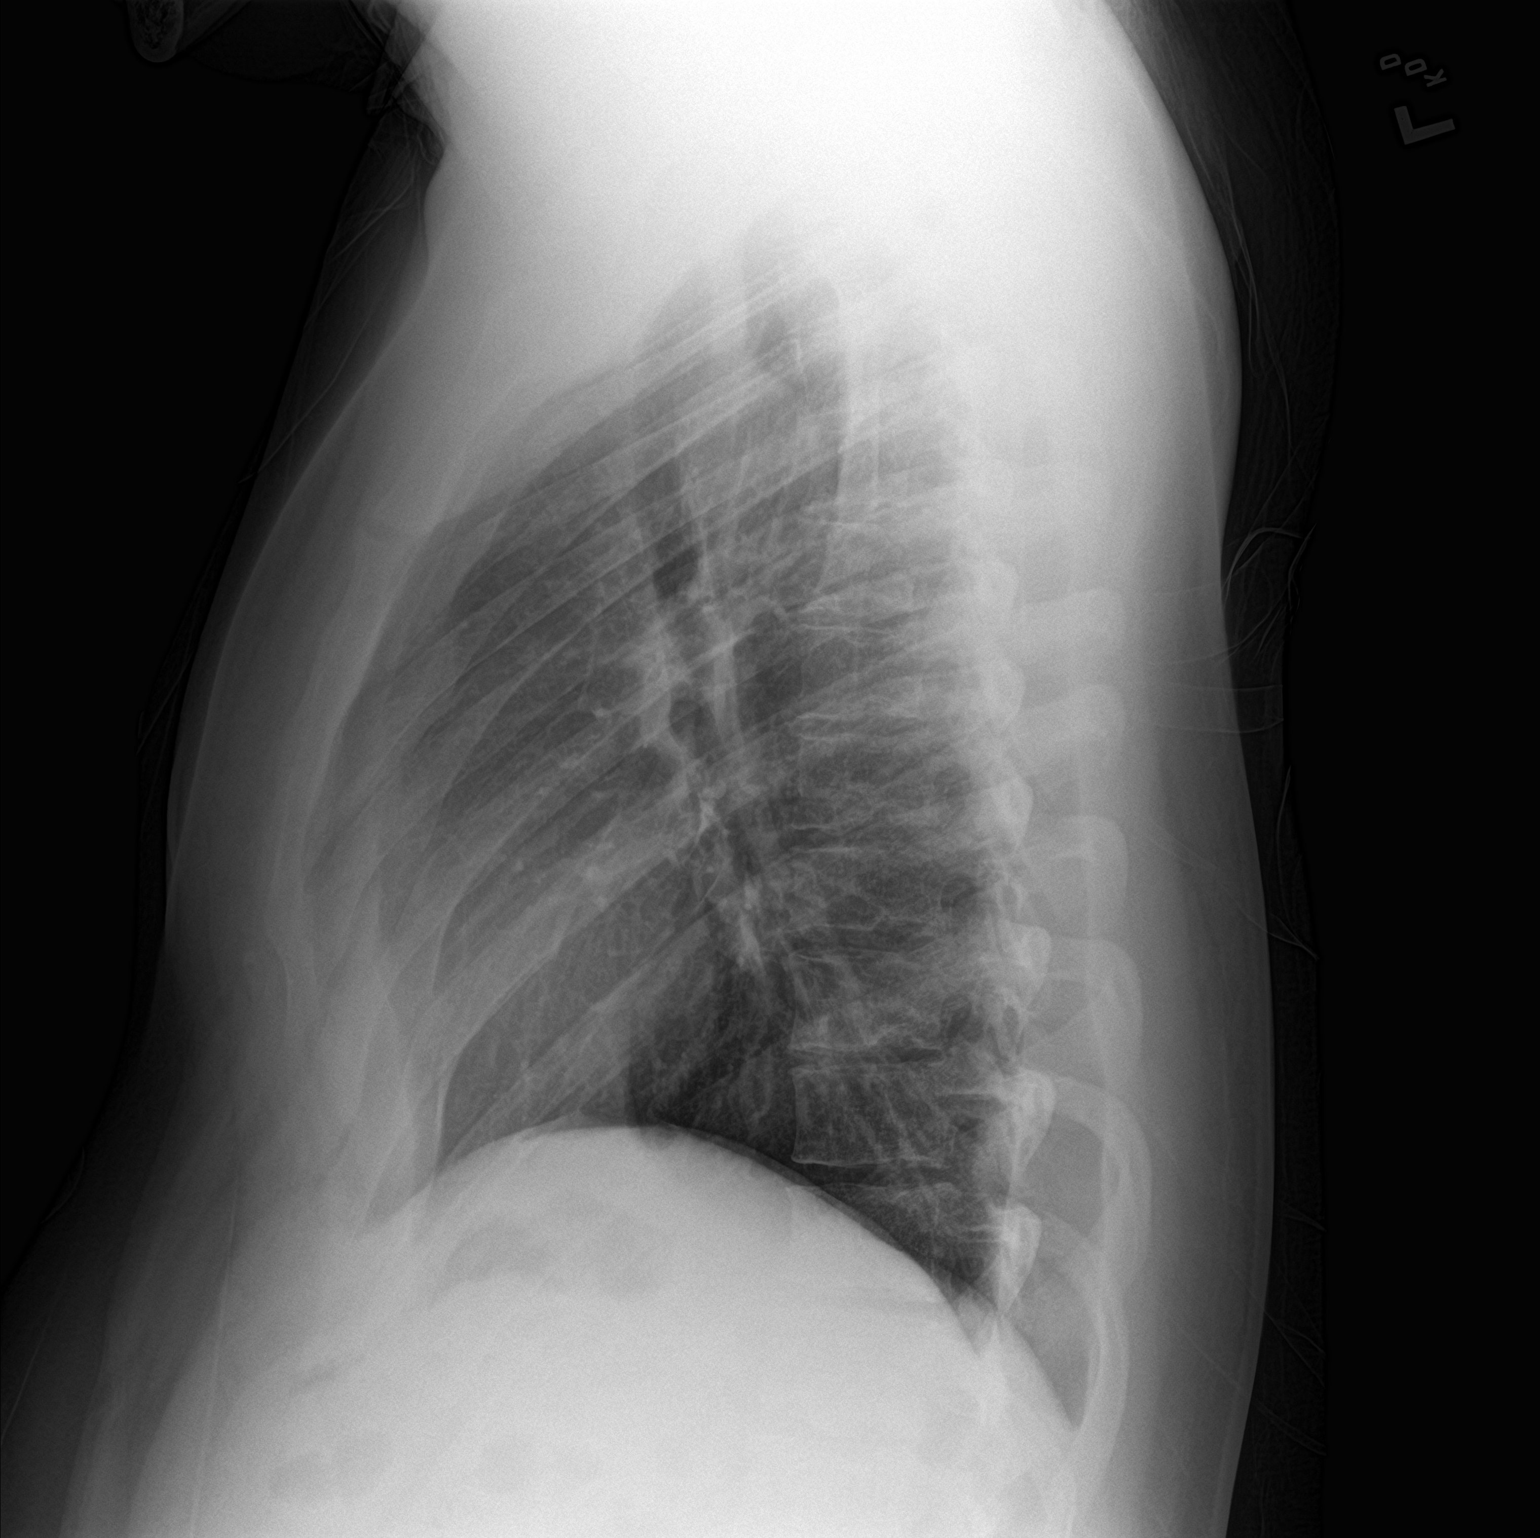

[2 of 2 positions shown; findings below may reference images not displayed]

FINDINGS: The heart size and mediastinal contours are within normal limits.
Both lungs are clear. The visualized skeletal structures are
unremarkable.
IMPRESSION: No active cardiopulmonary disease.

## 2017-02-27 MED ORDER — IPRATROPIUM-ALBUTEROL 0.5-2.5 (3) MG/3ML IN SOLN
3.0000 mL | Freq: Once | RESPIRATORY_TRACT | Status: AC
  Administered 2017-02-27: 3 mL via RESPIRATORY_TRACT
  Filled 2017-02-27: qty 3

## 2017-02-27 NOTE — ED Notes (Signed)
Pt gone to xray

## 2017-02-27 NOTE — ED Provider Notes (Signed)
[facility]ENCY [facility]PARTMENT Provider Note   CSN: [id number] Arrival date & time: 02/27/17  2251     History   Chief Complaint Chief Complaint  Patient presents with  . Cough    HPI Aaron Martin is a 21 y.o. male.  Patient presents with cough and chest congestion.  Symptoms began more than 1 week ago.  He has had a cough productive of thick sputum.  No associated fevers.  There is head congestion as well.  Patient started to feel like he was getting better, then today significantly worsened.  He has been having persistent cough today and this evening with shortness of breath.      Past Medical History:  Diagnosis Date  . Asthma   . Seizures (HCC)    febrile seizures    Patient Active Problem List   Diagnosis Date Noted  . Foreign body in anus and rectum     Past Surgical History:  Procedure Laterality Date  . FLEXIBLE SIGMOIDOSCOPY N/A 08/17/2016   Procedure: FLEXIBLE SIGMOIDOSCOPY;  Surgeon: West BaliFields, Sandi L, MD;  Location: [facility];  Service: Endoscopy;  Laterality: N/A;  900  . TONSILLECTOMY         Home Medications    Prior to Admission medications   Medication Sig Start Date End Date Taking? Authorizing Provider  benzonatate (TESSALON) 100 MG capsule Take 1 capsule (100 mg total) by mouth every 8 (eight) hours. 02/28/17   Gilda CreasePollina, Christopher J, MD  predniSONE (DELTASONE) 20 MG tablet Take 2 tablets (40 mg total) by mouth daily with breakfast. 02/28/17   Pollina, Canary Brimhristopher J, MD    Family History History reviewed. No pertinent family history.  Social History Social History   Tobacco Use  . Smoking status: Never Smoker  . Smokeless tobacco: Never Used  Substance Use Topics  . Alcohol use: Yes    Comment: occassion  . Drug use: No     Allergies   Patient has no known allergies.   Review of Systems Review of Systems  HENT: Positive for congestion.   Respiratory: Positive for cough and shortness of breath.   All other systems  reviewed and are negative.    Physical Exam Updated Vital Signs BP (!) 125/91 (BP Location: Left Arm)   Pulse 85   Temp 97.7 F (36.5 C) (Oral)   Resp (!) 22   Ht 6\' 3"  (1.905 m)   Wt 111.1 kg (245 lb)   SpO2 94%   BMI 30.62 kg/m   Physical Exam  Constitutional: He is oriented to person, place, and time. He appears well-developed and well-nourished. No distress.  HENT:  Head: Normocephalic and atraumatic.  Right Ear: Hearing normal.  Left Ear: Hearing normal.  Nose: Nose normal.  Mouth/Throat: Oropharynx is clear and moist and mucous membranes are normal.  Eyes: Conjunctivae and EOM are normal. Pupils are equal, round, and reactive to light.  Neck: Normal range of motion. Neck supple.  Cardiovascular: Regular rhythm, S1 normal and S2 normal. Exam reveals no gallop and no friction rub.  No murmur heard. Pulmonary/Chest: Effort normal and breath sounds normal. No respiratory distress. He exhibits no tenderness.  Abdominal: Soft. Normal appearance and bowel sounds are normal. There is no hepatosplenomegaly. There is no tenderness. There is no rebound, no guarding, no tenderness at McBurney's point and negative Murphy's sign. No hernia.  Musculoskeletal: Normal range of motion.  Neurological: He is alert and oriented to person, place, and time. He has normal strength. No cranial  nerve deficit or sensory deficit. Coordination normal. GCS eye subscore is 4. GCS verbal subscore is 5. GCS motor subscore is 6.  Skin: Skin is warm, dry and intact. No rash noted. No cyanosis.  Psychiatric: He has a normal mood and affect. His speech is normal and behavior is normal. Thought content normal.  Nursing note and vitals reviewed.    ED Treatments / Results  Labs (all labs ordered are listed, but only abnormal results are displayed) Labs Reviewed - No data to display  EKG  EKG Interpretation None       Radiology Dg Chest 2 View  Result Date: 02/28/2017 CLINICAL DATA:  Productive  cough EXAM: CHEST  2 VIEW COMPARISON:  05/15/2015 FINDINGS: The heart size and mediastinal contours are within normal limits. Both lungs are clear. The visualized skeletal structures are unremarkable. IMPRESSION: No active cardiopulmonary disease. Electronically Signed   By: Jasmine Pang M.D.   On: 02/28/2017 00:00    Procedures Procedures (including critical care time)  Medications Ordered in ED Medications  predniSONE (DELTASONE) tablet 60 mg (not administered)  albuterol (PROVENTIL HFA;VENTOLIN HFA) 108 (90 Base) MCG/ACT inhaler 2 puff (not administered)  ipratropium-albuterol (DUONEB) 0.5-2.5 (3) MG/3ML nebulizer solution 3 mL (3 mLs Nebulization Given 02/27/17 2314)     Initial Impression / Assessment and Plan / ED Course  I have reviewed the triage vital signs and the nursing notes.  Pertinent labs & imaging results that were available during my care of the patient were reviewed by me and considered in my medical decision making (see chart for details).     Patient presents to the ER for evaluation of difficulty breathing.  Symptoms began more than a week ago.  He has had progressively worsening cough and congestion.  Reports that history of asthma, but no problems for quite some time.  No active wheezing or bronchospasm on arrival.  Chest x-ray does not show evidence of pneumonia.  He is not hypoxic.  No fever.  Symptoms consistent with viral bronchitis.  Will treat with prednisone, albuterol, cough medicine.  Final Clinical Impressions(s) / ED Diagnoses   Final diagnoses:  Acute bronchitis, unspecified organism    ED Discharge Orders        Ordered    predniSONE (DELTASONE) 20 MG tablet  Daily with breakfast     02/28/17 0033    benzonatate (TESSALON) 100 MG capsule  Every 8 hours     02/28/17 0034       Gilda Crease, MD 02/28/17 [phone]

## 2017-02-27 NOTE — ED Triage Notes (Signed)
Pt with productive cough for over a week.

## 2017-02-28 MED ORDER — BENZONATATE 100 MG PO CAPS: 100.0000 mg | ORAL_CAPSULE | Freq: Three times a day (TID) | ORAL | 0 refills | Status: DC

## 2017-02-28 MED ORDER — PREDNISONE 50 MG PO TABS
60.0000 mg | ORAL_TABLET | Freq: Once | ORAL | Status: AC
  Administered 2017-02-28: 60 mg via ORAL
  Filled 2017-02-28: qty 1

## 2017-02-28 MED ORDER — PREDNISONE 20 MG PO TABS: 40.0000 mg | ORAL_TABLET | Freq: Every day | ORAL | 0 refills | Status: DC

## 2017-02-28 MED ORDER — ALBUTEROL SULFATE HFA 108 (90 BASE) MCG/ACT IN AERS: 2.0000 | INHALATION_SPRAY | RESPIRATORY_TRACT | Status: DC | PRN

## 2017-02-28 MED ORDER — GUAIFENESIN-CODEINE 100-10 MG/5ML PO SOLN
10.0000 mL | Freq: Once | ORAL | Status: AC
  Administered 2017-02-28: 10 mL via ORAL
  Filled 2017-02-28: qty 10

## 2017-08-13 DIAGNOSIS — R946 Abnormal results of thyroid function studies: Secondary | ICD-10-CM | POA: Diagnosis not present

## 2017-08-13 DIAGNOSIS — J45909 Unspecified asthma, uncomplicated: Secondary | ICD-10-CM | POA: Diagnosis not present

## 2017-09-27 DIAGNOSIS — E039 Hypothyroidism, unspecified: Secondary | ICD-10-CM | POA: Diagnosis not present

## 2018-04-20 DIAGNOSIS — E0590 Thyrotoxicosis, unspecified without thyrotoxic crisis or storm: Secondary | ICD-10-CM | POA: Diagnosis not present

## 2018-04-20 DIAGNOSIS — E039 Hypothyroidism, unspecified: Secondary | ICD-10-CM | POA: Diagnosis not present

## 2018-08-01 DIAGNOSIS — E6609 Other obesity due to excess calories: Secondary | ICD-10-CM | POA: Diagnosis not present

## 2018-08-01 DIAGNOSIS — E039 Hypothyroidism, unspecified: Secondary | ICD-10-CM | POA: Diagnosis not present

## 2019-07-04 ENCOUNTER — Other Ambulatory Visit: Payer: Self-pay

## 2019-07-04 ENCOUNTER — Ambulatory Visit: Payer: 59 | Attending: Internal Medicine

## 2019-07-04 DIAGNOSIS — Z20822 Contact with and (suspected) exposure to covid-19: Secondary | ICD-10-CM

## 2019-07-05 ENCOUNTER — Telehealth: Payer: Self-pay | Admitting: *Deleted

## 2019-07-05 LAB — SARS-COV-2, NAA 2 DAY TAT

## 2019-07-05 LAB — NOVEL CORONAVIRUS, NAA: SARS-CoV-2, NAA: DETECTED — AB

## 2019-07-05 NOTE — Telephone Encounter (Signed)
Patient calling in to review result of COVID test- + COVID. Encouraged OTC for symptoms( sinus, fatigue, headache), reach out to PCP for help with symptoms if needed and reviewed reasons to go to ED. Patient is aware of isolation recommendations and he has contacted his school and is following their recommendations as well. Patient aware health dept may contact him.

## 2019-07-06 ENCOUNTER — Telehealth (HOSPITAL_COMMUNITY): Payer: Self-pay | Admitting: Nurse Practitioner

## 2019-07-06 ENCOUNTER — Encounter: Payer: Self-pay | Admitting: Nurse Practitioner

## 2019-07-06 NOTE — Telephone Encounter (Signed)
Called to Discuss with patient about Covid symptoms and the use of bamlanivimab, a monoclonal antibody infusion for those with mild to moderate Covid symptoms and at a high risk of hospitalization.     Unclear if patient qualifies for MAB. Would like to screen him. Unable to reach pt. Left message & sent mychart message.   Consuello Masse, DNP, AGNP-C [phone] (Infusion Center Hotline)

## 2019-11-14 ENCOUNTER — Ambulatory Visit
Admission: EM | Admit: 2019-11-14 | Discharge: 2019-11-14 | Disposition: A | Discharge status: Home / Self Care | Payer: 59

## 2019-11-14 DIAGNOSIS — Z20822 Contact with and (suspected) exposure to covid-19: Secondary | ICD-10-CM

## 2019-11-14 DIAGNOSIS — J069 Acute upper respiratory infection, unspecified: Secondary | ICD-10-CM

## 2019-11-14 DIAGNOSIS — Z1152 Encounter for screening for COVID-19: Secondary | ICD-10-CM

## 2019-11-14 NOTE — ED Provider Notes (Signed)
[facility]   [id number] 11/14/19 Arrival Time: 1250   CC: COVID symptoms  SUBJECTIVE: History from: patient.  Aaron Martin is a 24 y.o. male who presents with sinus congestion and sore throat x 3 days.  Denies sick exposure to COVID, flu or strep.  Does admit to getting hit on RT side of face during a boxing expedition.  Denies alleviating or aggravating factors.  Reports previous symptoms in the past with sinus infection.   Denies fever, chills, fatigue, rhinorrhea, SOB, wheezing, chest pain, nausea, changes in bowel or bladder habits.    ROS: As per HPI.  All other pertinent ROS negative.     Past Medical History:  Diagnosis Date  . Asthma   . Seizures (HCC)    febrile seizures   Past Surgical History:  Procedure Laterality Date  . FLEXIBLE SIGMOIDOSCOPY N/A 08/17/2016   Procedure: FLEXIBLE SIGMOIDOSCOPY;  Surgeon: West Bali, MD;  Location: [facility];  Service: Endoscopy;  Laterality: N/A;  900  . TONSILLECTOMY     No Known Allergies No current facility-administered medications on file prior to encounter.   Current Outpatient Medications on File Prior to Encounter  Medication Sig Dispense Refill  . ALPRAZolam (XANAX XR) 2 MG 24 hr tablet Take 2 mg by mouth daily.    . busPIRone (BUSPAR) 15 MG tablet Take 15 mg by mouth at bedtime.    Marland Kitchen levothyroxine (SYNTHROID) 75 MCG tablet Take 75 mcg by mouth daily.     Social History   Socioeconomic History  . Marital status: Single    Spouse name: Not on file  . Number of children: Not on file  . Years of education: Not on file  . Highest education level: Not on file  Occupational History  . Not on file  Tobacco Use  . Smoking status: Never Smoker  . Smokeless tobacco: Never Used  Vaping Use  . Vaping Use: Never used  Substance and Sexual Activity  . Alcohol use: Yes    Comment: occassion  . Drug use: No  . Sexual activity: Not on file  Other Topics Concern  . Not on file  Social History  Narrative  . Not on file   Social Determinants of Health   Financial Resource Strain:   . Difficulty of Paying Living Expenses:   Food Insecurity:   . Worried About Programme researcher, broadcasting/film/video in the Last Year:   . Barista in the Last Year:   Transportation Needs:   . Freight forwarder (Medical):   Marland Kitchen Lack of Transportation (Non-Medical):   Physical Activity:   . Days of Exercise per Week:   . Minutes of Exercise per Session:   Stress:   . Feeling of Stress :   Social Connections:   . Frequency of Communication with Friends and Family:   . Frequency of Social Gatherings with Friends and Family:   . Attends Religious Services:   . Active Member of Clubs or Organizations:   . Attends Banker Meetings:   Marland Kitchen Marital Status:   Intimate Partner Violence:   . Fear of Current or Ex-Partner:   . Emotionally Abused:   Marland Kitchen Physically Abused:   . Sexually Abused:    History reviewed. No pertinent family history.  OBJECTIVE:  Vitals:   11/14/19 1304  BP: 125/78  Pulse: 85  Resp: 20  Temp: 98.7 F (37.1 C)  SpO2: 95%    General appearance: alert; well-appearing, nontoxic; speaking in  full sentences and tolerating own secretions HEENT: NCAT; Ears: EACs clear, TMs pearly gray; Eyes: PERRL.  EOM grossly intact.Nose: nares patent without rhinorrhea, Throat: oropharynx clear, tonsils non erythematous or enlarged, uvula midline  Neck: supple without LAD Lungs: unlabored respirations, symmetrical air entry; cough: absent; no respiratory distress; CTAB Heart: regular rate and rhythm.  Skin: warm and dry Psychological: alert and cooperative; normal mood and affect   ASSESSMENT & PLAN:  1. Encounter for screening for COVID-19   2. Viral URI   3. Suspected COVID-19 virus infection    COVID testing ordered.  It will take between 2-5 days for test results.  Someone will contact you regarding abnormal results.    In the meantime: You should remain isolated in your home  for 10 days from symptom onset AND greater than 72 hours after symptoms resolution (absence of fever without the use of fever-reducing medication and improvement in respiratory symptoms), whichever is longer Get plenty of rest and push fluids Use OTC zyrtec for nasal congestion, runny nose, and/or sore throat Use OTC flonase for nasal congestion and runny nose Use medications daily for symptom relief Use OTC medications like ibuprofen or tylenol as needed fever or pain Call or go to the ED if you have any new or worsening symptoms such as fever, worsening cough, shortness of breath, chest tightness, chest pain, turning blue, changes in mental status, etc...   Reviewed expectations re: course of current medical issues. Questions answered. Outlined signs and symptoms indicating need for more acute intervention. Patient verbalized understanding. After Visit Summary given.         Rennis Harding, PA-C 11/14/19 1341

## 2019-11-14 NOTE — Discharge Instructions (Signed)
COVID testing ordered.  It will take between 2-5 days for test results.  Someone will contact you regarding abnormal results.    In the meantime: You should remain isolated in your home for 10 days from symptom onset AND greater than 72 hours after symptoms resolution (absence of fever without the use of fever-reducing medication and improvement in respiratory symptoms), whichever is longer Get plenty of rest and push fluids Use OTC zyrtec for nasal congestion, runny nose, and/or sore throat Use OTC flonase for nasal congestion and runny nose Use medications daily for symptom relief Use OTC medications like ibuprofen or tylenol as needed fever or pain Call or go to the ED if you have any new or worsening symptoms such as fever, worsening cough, shortness of breath, chest tightness, chest pain, turning blue, changes in mental status, etc...  

## 2019-11-14 NOTE — ED Triage Notes (Signed)
Pt presents with c/o sinus congestion and sore throat that began on Sunday, pt also states that Saturday he was in boxing fight and was fit in affected side

## 2019-11-15 LAB — NOVEL CORONAVIRUS, NAA: SARS-CoV-2, NAA: NOT DETECTED

## 2019-11-15 LAB — SARS-COV-2, NAA 2 DAY TAT

## 2020-02-21 ENCOUNTER — Ambulatory Visit (INDEPENDENT_AMBULATORY_CARE_PROVIDER_SITE_OTHER): Payer: 59 | Admitting: Clinical

## 2020-02-21 ENCOUNTER — Other Ambulatory Visit: Payer: Self-pay

## 2020-02-21 DIAGNOSIS — F419 Anxiety disorder, unspecified: Secondary | ICD-10-CM

## 2020-02-21 DIAGNOSIS — F331 Major depressive disorder, recurrent, moderate: Secondary | ICD-10-CM | POA: Diagnosis not present

## 2020-02-21 NOTE — Progress Notes (Signed)
Virtual Visit via Telephone Note  I connected with Aaron Martin on 02/21/20 at  8:00 AM EST by telephone and verified that I am speaking with the correct person using two identifiers.  Location: Patient: Home Provider: Office   I discussed the limitations, risks, security and privacy concerns of performing an evaluation and management service by telephone and the availability of in person appointments. I also discussed with the patient that there may be a patient responsible charge related to this service. The patient expressed understanding and agreed to proceed.    Comprehensive Clinical Assessment (CCA) Note  02/21/2020 Aaron Martin [id number]  Chief Complaint: Depression/Anxiety Visit Diagnosis:  Depression/Anxiety   CCA Screening, Triage and Referral (STR)  Patient Reported Information How did you hear about [place]? No data recorded Referral name: No data recorded Referral phone number: No data recorded  Whom do you see for routine medical problems? No data recorded Practice/Facility Name: No data recorded Practice/Facility Phone Number: No data recorded Name of Contact: No data recorded Contact Number: No data recorded Contact Fax Number: No data recorded Prescriber Name: No data recorded Prescriber Address (if known): No data recorded  What Is the Reason for Your Visit/Call Today? No data recorded How Long Has This Been Causing You Problems? No data recorded What Do You Feel Would Help You the Most Today? No data recorded  Have You Recently Been in Any Inpatient Treatment (Hospital/Detox/Crisis Center/28-Day Program)? No data recorded Name/Location of Program/Hospital:No data recorded How Long Were You There? No data recorded When Were You Discharged? No data recorded  Have You Ever Received Services From [facility] Before? No data recorded Who Do You See at [facility]? No data recorded  Have You Recently Had Any Thoughts About Hurting Yourself? No data  recorded Are You Planning to Commit Suicide/Harm Yourself At This time? No data recorded  Have you Recently Had Thoughts About Hurting Someone Karolee Ohs? No data recorded Explanation: No data recorded  Have You Used Any Alcohol or Drugs in the Past 24 Hours? No data recorded How Long Ago Did You Use Drugs or Alcohol? No data recorded What Did You Use and How Much? No data recorded  Do You Currently Have a Therapist/Psychiatrist? No data recorded Name of Therapist/Psychiatrist: No data recorded  Have You Been Recently Discharged From Any Office Practice or Programs? No data recorded Explanation of Discharge From Practice/Program: No data recorded    CCA Screening Triage Referral Assessment Type of Contact: No data recorded Is this Initial or Reassessment? No data recorded Date Telepsych consult ordered in [facility]:  No data recorded Time Telepsych consult ordered in [facility]:  No data recorded  Patient Reported Information Reviewed? No data recorded Patient Left Without Being Seen? No data recorded Reason for Not Completing Assessment: No data recorded  Collateral Involvement: No data recorded  Does Patient Have a Court Appointed Legal Guardian? No data recorded Name and Contact of Legal Guardian: No data recorded If Minor and Not Living with Parent(s), Who has Custody? No data recorded Is CPS involved or ever been involved? No data recorded Is APS involved or ever been involved? No data recorded  Patient Determined To Be At Risk for Harm To Self or Others Based on Review of Patient Reported Information or Presenting Complaint? No data recorded Method: No data recorded Availability of Means: No data recorded Intent: No data recorded Notification Required: No data recorded Additional Information for Danger to Others Potential: No data recorded Additional Comments for Danger to  Others Potential: No data recorded Are There Guns or Other Weapons in Your Home? No data recorded Types of  Guns/Weapons: No data recorded Are These Weapons Safely Secured?                            No data recorded Who Could Verify You Are Able To Have These Secured: No data recorded Do You Have any Outstanding Charges, Pending Court Dates, Parole/Probation? No data recorded Contacted To Inform of Risk of Harm To Self or Others: No data recorded  Location of Assessment: No data recorded  Does Patient Present under Involuntary Commitment? No data recorded IVC Papers Initial File Date: No data recorded  [place] of Residence: No data recorded  Patient Currently Receiving the Following Services: No data recorded  Determination of Need: No data recorded  Options For Referral: No data recorded    CCA Biopsychosocial Intake/Chief Complaint:  Difficulty with regulating mood  Current Symptoms/Problems: Self Destructive Ideations,   Patient Reported Schizophrenia/Schizoaffective Diagnosis in Past: No   Strengths: I am really good with having empathy for others. intellegent. 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 going to [employer], reading on psychology  Abilities: Gaming, Reading   Type of Services Patient Feels are Needed: Medication Management and Individual Therapy   Initial Clinical Notes/Concerns: No Additional   Mental Health Symptoms Depression:  Change in energy/activity;Difficulty Concentrating;Fatigue;Hopelessness;Irritability;Sleep (too much or little);Worthlessness   Duration of Depressive symptoms: Greater than two weeks   Mania:  None   Anxiety:   Difficulty concentrating;Fatigue;Irritability;Restlessness;Sleep;Tension;Worrying   Psychosis:  None   Duration of Psychotic symptoms: No data recorded  Trauma:  None   Obsessions:  None   Compulsions:  None   Inattention:  None   Hyperactivity/Impulsivity:  N/A   Oppositional/Defiant Behaviors:  None   Emotional Irregularity:  None   Other Mood/Personality Symptoms:  No Additional     Mental Status Exam Appearance and self-care  Stature:  Tall   Weight:  Average weight   Clothing:  Casual   Grooming:  Normal   Cosmetic use:  None   Posture/gait:  Normal   Motor activity:  Not Remarkable   Sensorium  Attention:  Normal   Concentration:  Anxiety interferes   Orientation:  X5   Recall/memory:  Defective in Short-term   Affect and Mood  Affect:  Appropriate   Mood:  Depressed;Anxious   Relating  Eye contact:  Normal   Facial expression:  Responsive   Attitude toward examiner:  Cooperative   Thought and Language  Speech flow: Normal   Thought content:  Appropriate to Mood and Circumstances   Preoccupation:  None   Hallucinations:  None   Organization:  No data recorded  [employer] of Knowledge:  Good   Intelligence:  Average   Abstraction:  Normal   Judgement:  Good   Reality Testing:  Realistic   Insight:  Good   Decision Making:  Normal   Social Functioning  Social Maturity:  Responsible   Social Judgement:  Normal   Stress  Stressors:  School;Work   Coping Ability:  Normal   Skill Deficits:  Activities of daily living   Supports:  Friends/Service system;Family     Religion: Religion/Spirituality Are You A Religious Person?: No  Leisure/Recreation: Leisure / Recreation Do You Have Hobbies?: Yes Leisure and Hobbies: Going to [employer]  Exercise/Diet: Exercise/Diet Do You Exercise?: Yes What Type of Exercise Do  You Do?: Weight Training, Run/Walk How Many Times a Week Do You Exercise?: 4-5 times a week Have You Gained or Lost A Significant Amount of Weight in the Past Six Months?: No Do You Follow a Special Diet?: No Do You Have Any Trouble Sleeping?: Yes Explanation of Sleeping Difficulties: The patient notes difficulty with falling asleep   CCA Employment/Education Employment/Work Situation: Employment / Work Situation Employment situation: Employed Where is patient currently employed?:  [employer] How long has patient been employed?: 16yrs Patient's job has been impacted by current illness: No What is the longest time patient has a held a job?: 2 and a half years Where was the patient employed at that time?: [employer] Has patient ever been in the [employer]?: No  Education: Education Is Patient Currently Attending School?: Yes School Currently Attending: Currently Attending school at [employer] Last Grade Completed: 12 Name of [employer] School: [employer] Early college high school Did Garment/textile technologist From [employer]?: Yes Did Theme park manager?: Yes What Type of College Degree Do you Have?: Associates Degree from [employer]. Currently in last year [employer] Degree Did You Attend Graduate School?: No What Was Your Major?: NA Did You Have Any Special Interests In School?: NA Did You Have An Individualized Education Program (IIEP): No Did You Have Any Difficulty At School?: No Patient's Education Has Been Impacted by Current Illness: No   CCA Family/Childhood History Family and Relationship History: Family history Marital status: Single Are you sexually active?: No What is your sexual orientation?: heterosexual Has your sexual activity been affected by drugs, alcohol, medication, or emotional stress?: Na Does patient have children?: No  Childhood History:  Childhood History By whom was/is the patient raised?: Both parents Additional childhood history information: None Description of patient's relationship with caregiver when they were a child: Good realtionship Patient's description of current relationship with people who raised him/her: Average How were you disciplined when you got in trouble as a child/adolescent?: Grounding Does patient have siblings?: Yes Number of Siblings: 1 Description of patient's current relationship with siblings: The patient notes, " I get along with my brother". Did patient suffer any  verbal/emotional/physical/sexual abuse as a child?: No Did patient suffer from severe childhood neglect?: No Has patient ever been sexually abused/assaulted/raped as an adolescent or adult?: No Was the patient ever a victim of a crime or a disaster?: No Witnessed domestic violence?: No Has patient been affected by domestic violence as an adult?: No  Child/Adolescent Assessment:     CCA Substance Use Alcohol/Drug Use: Alcohol / Drug Use Pain Medications: None Prescriptions: See MAR Over the Counter: Sleep Aid History of alcohol / drug use?: No history of alcohol / drug abuse Longest period of sobriety (when/how long): NA                         ASAM's:  Six Dimensions of Multidimensional Assessment  Dimension 1:  Acute Intoxication and/or Withdrawal Potential:      Dimension 2:  Biomedical Conditions and Complications:      Dimension 3:  Emotional, Behavioral, or Cognitive Conditions and Complications:     Dimension 4:  Readiness to Change:     Dimension 5:  Relapse, Continued use, or Continued Problem Potential:     Dimension 6:  Recovery/Living Environment:     ASAM Severity Score:    ASAM Recommended Level of Treatment:     Substance use Disorder (SUD)    Recommendations for Services/Supports/Treatments: Recommendations for  Services/Supports/Treatments Recommendations For Services/Supports/Treatments: Individual Therapy, Medication Management  DSM5 Diagnoses: Patient Active Problem List   Diagnosis Date Noted  . Foreign body in anus and rectum     Patient Centered Plan: Patient is on the following Treatment Plan(s):  Depression/Anxiety  Referrals to Alternative Service(s): Referred to Alternative Service(s):   Place:   Date:   Time:    Referred to Alternative Service(s):   Place:   Date:   Time:    Referred to Alternative Service(s):   Place:   Date:   Time:    Referred to Alternative Service(s):   Place:   Date:   Time:      I discussed the  assessment and treatment plan with the patient. The patient was provided an opportunity to ask questions and all were answered. The patient agreed with the plan and demonstrated an understanding of the instructions.   The patient was advised to call back or seek an in-person evaluation if the symptoms worsen or if the condition fails to improve as anticipated.  I provided 60 minutes of non-face-to-face time during this encounter.  Winfred Burn, LCSW  02/21/2020

## 2020-03-19 ENCOUNTER — Other Ambulatory Visit: Payer: Self-pay

## 2020-03-19 ENCOUNTER — Ambulatory Visit (INDEPENDENT_AMBULATORY_CARE_PROVIDER_SITE_OTHER): Payer: 59 | Admitting: Clinical

## 2020-03-19 DIAGNOSIS — F331 Major depressive disorder, recurrent, moderate: Secondary | ICD-10-CM | POA: Diagnosis not present

## 2020-03-19 DIAGNOSIS — F419 Anxiety disorder, unspecified: Secondary | ICD-10-CM

## 2020-03-19 NOTE — Progress Notes (Signed)
  Virtual Visit via Telephone Note  I connected withJoshua Gantton 12/21/21at 9:00 AM ESTby telephoneand verified that I am speaking with the correct person using two identifiers.  Location: Patient:Home Provider:Office  I discussed the limitations, risks, security and privacy concerns of performing an evaluation and management service by telephone and the availability of in person appointments. I also discussed with the patient that there may be a patient responsible charge related to this service. The patient expressed understanding and agreed to proceed.   THERAPIST PROGRESS NOTE  Session Time:9:00AM-9:45AM  Participation Level:Active  Behavioral Response:CasualAlertDepressed  Type of Therapy:Individual Therapy  Treatment Goals addressed:Coping  Interventions:CBT, Motivational Interviewing, Strength-based and Supportive  Summary:Kysean Ganttis 24.o.malewho presents withDepression and Anxiety.The OPT therapist worked with thepatientfor hisinitial scheduledsession. The OPT therapist utilized Motivational Interviewing to assist in creating therapeutic repore. The patient in the session was engaged and work in collaboration giving feedback about his triggers and symptoms over the past few weeks. The patient in this session spoke about being in [place] and staying with a long time friend, the change and felling less overwhelmed due to not currently being in school and being on Christmas Break, as well as his upcoming plans for the Christmas Holiday with his return today to [place].The OPT therapist utilized Cognitive Behavioral Therapy through cognitive restructuring as well as worked with the patient on coping strategies to assist in management ofmood andanxiety.  Suicidal/Homicidal:Nowithout intent/plan  Therapist Response:The OPT therapist worked with the patient for the patients scheduled session. The patient was engaged in his  session and gave feedback in relation to triggers, symptoms, and behavior responses over the pastfewweeksincludingwork to manage mood and stressors.The patientindicated he doingbetter since going on break fromschool and has been over the past week in [place] visiting a long time friend.The OPT therapist worked with the patient utilizing an in session Cognitive Behavioral Therapy exercise. The patient was responsive in the session and verbalized, "Ithink I have been doing better I have enjoyed this vacation and being on break from school which was big stressor" .The OPT therapist worked with the patient providing psychoeducationand promoting self care.The OPT therapist will continue treatment work with the patient in his next scheduled session.  Plan: Return again in3weeks.  Diagnosis:Axis I: Recurrent, moderate major depression with anxiety Axis II:No diagnosis  I discussed the assessment and treatment plan with the patient. The patient was provided an opportunity to ask questions and all were answered. The patient agreed with the plan and demonstrated an understanding of the instructions.  The patient was advised to call back or seek an in-person evaluation if the symptoms worsen or if the condition fails to improve as anticipated.  I provided37minutes of non-face-to-face time during this encounter.  Suzan Garibaldi, LCSW

## 2020-04-08 ENCOUNTER — Other Ambulatory Visit: Payer: Self-pay

## 2020-04-08 ENCOUNTER — Ambulatory Visit (INDEPENDENT_AMBULATORY_CARE_PROVIDER_SITE_OTHER): Payer: Self-pay | Admitting: Clinical

## 2020-04-08 DIAGNOSIS — F419 Anxiety disorder, unspecified: Secondary | ICD-10-CM

## 2020-04-08 DIAGNOSIS — F331 Major depressive disorder, recurrent, moderate: Secondary | ICD-10-CM

## 2020-04-08 NOTE — Progress Notes (Signed)
  Virtual Visit via Telephone Note  I connected withJoshua Martin 1/10/22at10:00 AM ESTby telephoneand verified that I am speaking with the correct person using two identifiers.  Location: Patient:Home Provider:Office  I discussed the limitations, risks, security and privacy concerns of performing an evaluation and management service by telephone and the availability of in person appointments. I also discussed with the patient that there may be a patient responsible charge related to this service. The patient expressed understanding and agreed to proceed.   THERAPIST PROGRESS NOTE  Session Time:10:00AM-10:30AM  Participation Level:Active  Behavioral Response:CasualAlertDepressed  Type of Therapy:Individual Therapy  Treatment Goals addressed:Coping  Interventions:CBT, Motivational Interviewing, Strength-based and Supportive  Summary:Aaron Martin 24.o.malewho presents withDepression and Anxiety.The OPT therapist worked with thepatientfor hisinitial scheduledsession. The OPT therapist utilized Motivational Interviewing to assist in creating therapeutic repore. The patient in the session was engaged and work in collaboration giving feedback about his triggers and symptoms over the past few weeks including readjusting to being back home in [place], going today for a COVID test, adjusting to the start of his school Spring semester and interactions with his friend who he recently visited in [place].The OPT therapist utilized Cognitive Behavioral Therapy through cognitive restructuring as well as worked with the patient on coping strategies to assist in management ofmood andanxiety.The OPT therapist worked with the patient on focus of time and energy in areas he has control in.  Suicidal/Homicidal:Nowithout intent/plan  Therapist Response:The OPT therapist worked with the patient for the patients scheduled session. The patient was engaged in his session  and gave feedback in relation to triggers, symptoms, and behavior responses over the pastfewweeksincludingwork to manage moodand stressors.The patientindicated he is working to balance multiple things at once including health, relationship, academics, adjustment to medication change, and work.The OPT therapist worked with the patient utilizing an in session Cognitive Behavioral Therapy exercise. The patient was responsive in the session and verbalized, "Ithink I have had difficulty with focusing on helping other people and not so great with self care and my mental health" .The OPT therapist worked with the patient providing psychoeducationand promoting self care.The OPT therapist will continue treatment work with the patient in his next scheduled session.  Plan: Return again in3weeks.  Diagnosis:Axis I: Recurrent, moderate major depression with anxiety Axis II:No diagnosis  I discussed the assessment and treatment plan with the patient. The patient was provided an opportunity to ask questions and all were answered. The patient agreed with the plan and demonstrated an understanding of the instructions.  The patient was advised to call back or seek an in-person evaluation if the symptoms worsen or if the condition fails to improve as anticipated.  I provided79minutes of non-face-to-face time during this encounter.  Suzan Garibaldi, LCSW  04/08/2020

## 2020-04-22 ENCOUNTER — Other Ambulatory Visit: Payer: Self-pay

## 2020-04-22 ENCOUNTER — Ambulatory Visit (INDEPENDENT_AMBULATORY_CARE_PROVIDER_SITE_OTHER): Payer: Self-pay | Admitting: Clinical

## 2020-04-22 DIAGNOSIS — F419 Anxiety disorder, unspecified: Secondary | ICD-10-CM

## 2020-04-22 DIAGNOSIS — E663 Overweight: Secondary | ICD-10-CM | POA: Diagnosis not present

## 2020-04-22 DIAGNOSIS — E039 Hypothyroidism, unspecified: Secondary | ICD-10-CM | POA: Diagnosis not present

## 2020-04-22 DIAGNOSIS — F331 Major depressive disorder, recurrent, moderate: Secondary | ICD-10-CM

## 2020-04-22 DIAGNOSIS — Z6828 Body mass index (BMI) 28.0-28.9, adult: Secondary | ICD-10-CM | POA: Diagnosis not present

## 2020-04-22 NOTE — Progress Notes (Signed)
  Virtual Visit via Telephone Note  I connected withJoshua Martin 1/24/22at10:00AM ESTby telephoneand verified that I am speaking with the correct person using two identifiers.  Location: Patient:Home Provider:Office  I discussed the limitations, risks, security and privacy concerns of performing an evaluation and management service by telephone and the availability of in person appointments. I also discussed with the patient that there may be a patient responsible charge related to this service. The patient expressed understanding and agreed to proceed.   THERAPIST PROGRESS NOTE  Session Time:10:00AM-10:45AM  Participation Level:Active  Behavioral Response:CasualAlertDepressed  Type of Therapy:Individual Therapy  Treatment Goals addressed:Coping  Interventions:CBT, Motivational Interviewing, Strength-based and Supportive  Summary:Aaron Ganttis 24.o.malewho presents withDepressionand Anxiety.The OPT therapist worked with thepatientfor hisinitialscheduledsession. The OPT therapist utilized Motivational Interviewing to assist in creating therapeutic repore. The patient in the session was engaged and work in collaboration giving feedback about his triggers and symptoms over the past few weeks including recovering from COVID and starting back to school for his college Spring semester.The OPT therapist utilized Cognitive Behavioral Therapy through cognitive restructuring as well as worked with the patient on coping strategies to assist in management ofmood andanxiety.The OPT therapist worked with the patient on self esteem and positive affirmation.  Suicidal/Homicidal:Nowithout intent/plan  Therapist Response:The OPT therapist worked with the patient for the patients scheduled session. The patient was engaged in his session and gave feedback in relation to triggers, symptoms, and behavior responses over the pastfewweeksincludingwork to  manage moodandstressors.The patientindicated he is working on self esteem and taking positive feedback.The OPT therapist worked with the patient utilizing an in session Cognitive Behavioral Therapy exercise. The patient was responsive in the session and verbalized, "Ithink I have to just keep working on self esteem I have been using people who doubt me as fuel to motivate me" .The OPT therapist worked with the patient providing psychoeducationand promotingself care.The OPT therapist will continue treatment work with the patient in his next scheduled session.  Plan: Return again in3weeks.  Diagnosis:Axis I: Recurrent, moderate major depression with anxiety Axis II:No diagnosis  I discussed the assessment and treatment plan with the patient. The patient was provided an opportunity to ask questions and all were answered. The patient agreed with the plan and demonstrated an understanding of the instructions.  The patient was advised to call back or seek an in-person evaluation if the symptoms worsen or if the condition fails to improve as anticipated.  I provided33minutes of non-face-to-face time during this encounter.  Suzan Garibaldi, LCSW 04/22/2020

## 2020-04-29 DIAGNOSIS — F39 Unspecified mood [affective] disorder: Secondary | ICD-10-CM | POA: Diagnosis not present

## 2020-04-29 DIAGNOSIS — F411 Generalized anxiety disorder: Secondary | ICD-10-CM | POA: Diagnosis not present

## 2020-05-13 ENCOUNTER — Ambulatory Visit (INDEPENDENT_AMBULATORY_CARE_PROVIDER_SITE_OTHER): Payer: BC Managed Care – PPO | Admitting: Clinical

## 2020-05-13 ENCOUNTER — Other Ambulatory Visit: Payer: Self-pay

## 2020-05-13 DIAGNOSIS — F39 Unspecified mood [affective] disorder: Secondary | ICD-10-CM | POA: Diagnosis not present

## 2020-05-13 DIAGNOSIS — F419 Anxiety disorder, unspecified: Secondary | ICD-10-CM

## 2020-05-13 DIAGNOSIS — F331 Major depressive disorder, recurrent, moderate: Secondary | ICD-10-CM | POA: Diagnosis not present

## 2020-05-13 DIAGNOSIS — F411 Generalized anxiety disorder: Secondary | ICD-10-CM | POA: Diagnosis not present

## 2020-05-13 NOTE — Progress Notes (Signed)
Virtual Visit via Video Note  I connected with Aaron Martin on 05/13/20 at 10:00 AM EST by a video enabled telemedicine application and verified that I am speaking with the correct person using two identifiers.  Location: Patient: Home Provider: Office   I discussed the limitations of evaluation and management by telemedicine and the availability of in person appointments. The patient expressed understanding and agreed to proceed.  THERAPIST PROGRESS NOTE  Session Time:10:00AM-10:45AM  Participation Level:Active  Behavioral Response:CasualAlertDepressed  Type of Therapy:Individual Therapy  Treatment Goals addressed:Coping  Interventions:CBT, Motivational Interviewing, Strength-based and Supportive  Summary:Aaron Martin a24y.o.malewho presents withDepressionand Anxiety.The OPT therapist worked with thepatientfor hisinitialscheduledsession. The OPT therapist utilized Motivational Interviewing to assist in creating therapeutic repore. The patient in the session was engaged and work in collaboration giving feedback about his triggers and symptoms over the past few weeksincluding school stress, interactions with friends, and his relationship [place] .The OPT therapist utilized Engineer, manufacturing systems Therapy through cognitive restructuring as well as worked with the patient on coping strategies to assist in management ofmood andanxiety.The OPT therapist worked with the patient on self esteem and positive affirmation and balancing his work with leisure.  Suicidal/Homicidal:Nowithout intent/plan  Therapist Response:The OPT therapist worked with the patient for the patients scheduled session. The patient was engaged in his session and gave feedback in relation to triggers, symptoms, and behavior responses over the pastfewweeksincludingwork to manage moodandstressors.The patientindicated heis working on self esteem and taking positive  feedback.The OPT therapist worked with the patient utilizing an in session Cognitive Behavioral Therapy exercise. The patient was responsive in the session and verbalized, "Iam focused right now on getting to Spring Break, finishing my semester, and moving to [place] moving in with my girlfriend" .The OPT therapist worked with the patient providing psychoeducationand promotingself care and implementing leisure.The OPT therapist will continue treatment work with the patient in his next scheduled session.  Plan: Return again in3weeks.  Diagnosis:Axis I: Recurrent, moderate major depression with anxiety Axis II:No diagnosis  I discussed the assessment and treatment plan with the patient. The patient was provided an opportunity to ask questions and all were answered. The patient agreed with the plan and demonstrated an understanding of the instructions.  The patient was advised to call back or seek an in-person evaluation if the symptoms worsen or if the condition fails to improve as anticipated.  I provided60minutes of non-face-to-face time during this encounter.  Suzan Garibaldi, LCSW  05/13/2020

## 2020-06-03 ENCOUNTER — Ambulatory Visit (HOSPITAL_COMMUNITY): Payer: BC Managed Care – PPO | Admitting: Clinical

## 2020-06-10 ENCOUNTER — Telehealth (HOSPITAL_COMMUNITY): Payer: Self-pay | Admitting: Clinical

## 2020-06-10 ENCOUNTER — Other Ambulatory Visit: Payer: Self-pay

## 2020-06-10 ENCOUNTER — Ambulatory Visit (HOSPITAL_COMMUNITY): Payer: BC Managed Care – PPO | Admitting: Clinical

## 2020-06-10 NOTE — Telephone Encounter (Signed)
Pt did not respond to video link, phone call, or VM 

## 2020-06-24 DIAGNOSIS — F411 Generalized anxiety disorder: Secondary | ICD-10-CM | POA: Diagnosis not present

## 2020-06-24 DIAGNOSIS — Z79899 Other long term (current) drug therapy: Secondary | ICD-10-CM | POA: Diagnosis not present

## 2020-06-24 DIAGNOSIS — F39 Unspecified mood [affective] disorder: Secondary | ICD-10-CM | POA: Diagnosis not present

## 2020-07-15 DIAGNOSIS — F411 Generalized anxiety disorder: Secondary | ICD-10-CM | POA: Diagnosis not present

## 2020-07-15 DIAGNOSIS — F39 Unspecified mood [affective] disorder: Secondary | ICD-10-CM | POA: Diagnosis not present

## 2020-07-19 DIAGNOSIS — F419 Anxiety disorder, unspecified: Secondary | ICD-10-CM | POA: Diagnosis not present

## 2020-07-19 DIAGNOSIS — E039 Hypothyroidism, unspecified: Secondary | ICD-10-CM | POA: Diagnosis not present

## 2020-07-19 DIAGNOSIS — Z6830 Body mass index (BMI) 30.0-30.9, adult: Secondary | ICD-10-CM | POA: Diagnosis not present

## 2020-07-19 DIAGNOSIS — Z1331 Encounter for screening for depression: Secondary | ICD-10-CM | POA: Diagnosis not present

## 2020-09-26 DIAGNOSIS — F419 Anxiety disorder, unspecified: Secondary | ICD-10-CM | POA: Diagnosis not present

## 2020-09-26 DIAGNOSIS — E039 Hypothyroidism, unspecified: Secondary | ICD-10-CM | POA: Diagnosis not present

## 2020-09-26 DIAGNOSIS — F909 Attention-deficit hyperactivity disorder, unspecified type: Secondary | ICD-10-CM | POA: Diagnosis not present

## 2020-11-01 DIAGNOSIS — F909 Attention-deficit hyperactivity disorder, unspecified type: Secondary | ICD-10-CM | POA: Diagnosis not present

## 2020-11-01 DIAGNOSIS — E6609 Other obesity due to excess calories: Secondary | ICD-10-CM | POA: Diagnosis not present

## 2020-11-01 DIAGNOSIS — F419 Anxiety disorder, unspecified: Secondary | ICD-10-CM | POA: Diagnosis not present

## 2020-11-01 DIAGNOSIS — Z6831 Body mass index (BMI) 31.0-31.9, adult: Secondary | ICD-10-CM | POA: Diagnosis not present

## 2020-11-21 DIAGNOSIS — R41840 Attention and concentration deficit: Secondary | ICD-10-CM | POA: Diagnosis not present

## 2020-11-21 DIAGNOSIS — E6609 Other obesity due to excess calories: Secondary | ICD-10-CM | POA: Diagnosis not present

## 2020-11-21 DIAGNOSIS — E039 Hypothyroidism, unspecified: Secondary | ICD-10-CM | POA: Diagnosis not present

## 2020-11-21 DIAGNOSIS — F419 Anxiety disorder, unspecified: Secondary | ICD-10-CM | POA: Diagnosis not present

## 2020-11-21 DIAGNOSIS — Z6830 Body mass index (BMI) 30.0-30.9, adult: Secondary | ICD-10-CM | POA: Diagnosis not present

## 2020-11-30 ENCOUNTER — Emergency Department (HOSPITAL_COMMUNITY)
Admission: EM | Admit: 2020-11-30 | Discharge: 2020-11-30 | Disposition: A | Discharge status: Home / Self Care | Payer: 59 | Attending: Emergency Medicine | Admitting: Emergency Medicine

## 2020-11-30 ENCOUNTER — Encounter (HOSPITAL_COMMUNITY): Payer: Self-pay

## 2020-11-30 ENCOUNTER — Other Ambulatory Visit: Payer: Self-pay

## 2020-11-30 DIAGNOSIS — T7840XA Allergy, unspecified, initial encounter: Secondary | ICD-10-CM | POA: Diagnosis present

## 2020-11-30 DIAGNOSIS — J45909 Unspecified asthma, uncomplicated: Secondary | ICD-10-CM | POA: Diagnosis not present

## 2020-11-30 HISTORY — DX: Tourette's disorder: F95.2

## 2020-11-30 LAB — CBC WITH DIFFERENTIAL/PLATELET
Abs Immature Granulocytes: 0.02 10*3/uL (ref 0.00–0.07)
Basophils Absolute: 0 10*3/uL (ref 0.0–0.1)
Basophils Relative: 0 %
Eosinophils Absolute: 0.1 10*3/uL (ref 0.0–0.5)
Eosinophils Relative: 1 %
HCT: 51.2 % (ref 39.0–52.0)
Hemoglobin: 18.2 g/dL — ABNORMAL HIGH (ref 13.0–17.0)
Immature Granulocytes: 0 %
Lymphocytes Relative: 25 %
Lymphs Abs: 1.8 10*3/uL (ref 0.7–4.0)
MCH: 30.2 pg (ref 26.0–34.0)
MCHC: 35.5 g/dL (ref 30.0–36.0)
MCV: 85 fL (ref 80.0–100.0)
Monocytes Absolute: 1.1 10*3/uL — ABNORMAL HIGH (ref 0.1–1.0)
Monocytes Relative: 14 %
Neutro Abs: 4.4 10*3/uL (ref 1.7–7.7)
Neutrophils Relative %: 60 %
Platelets: 228 10*3/uL (ref 150–400)
RBC: 6.02 MIL/uL — ABNORMAL HIGH (ref 4.22–5.81)
RDW: 13.2 % (ref 11.5–15.5)
WBC: 7.4 10*3/uL (ref 4.0–10.5)
nRBC: 0 % (ref 0.0–0.2)

## 2020-11-30 LAB — BASIC METABOLIC PANEL
Anion gap: 7 (ref 5–15)
BUN: 19 mg/dL (ref 6–20)
CO2: 24 mmol/L (ref 22–32)
Calcium: 8.9 mg/dL (ref 8.9–10.3)
Chloride: 104 mmol/L (ref 98–111)
Creatinine, Ser: 1.18 mg/dL (ref 0.61–1.24)
GFR, Estimated: >60 mL/min (ref >60)
Glucose, Bld: 102 mg/dL — ABNORMAL HIGH (ref 70–99)
Potassium: 3.6 mmol/L (ref 3.5–5.1)
Sodium: 135 mmol/L (ref 135–145)

## 2020-11-30 MED ORDER — FAMOTIDINE 20 MG PO TABS: 20.0000 mg | ORAL_TABLET | Freq: Two times a day (BID) | ORAL | 0 refills | Status: AC

## 2020-11-30 MED ORDER — METHYLPREDNISOLONE SODIUM SUCC 125 MG IJ SOLR
125.0000 mg | Freq: Once | INTRAMUSCULAR | Status: AC
  Administered 2020-11-30: 125 mg via INTRAVENOUS
  Filled 2020-11-30: qty 2

## 2020-11-30 MED ORDER — PREDNISONE 50 MG PO TABS: ORAL_TABLET | ORAL | 0 refills | Status: AC

## 2020-11-30 MED ORDER — FAMOTIDINE IN NACL 20-0.9 MG/50ML-% IV SOLN
20.0000 mg | Freq: Once | INTRAVENOUS | Status: AC
  Administered 2020-11-30: 20 mg via INTRAVENOUS
  Filled 2020-11-30: qty 50

## 2020-11-30 MED ORDER — ONDANSETRON HCL 4 MG/2ML IJ SOLN
4.0000 mg | Freq: Once | INTRAMUSCULAR | Status: AC
  Administered 2020-11-30: 4 mg via INTRAVENOUS
  Filled 2020-11-30: qty 2

## 2020-11-30 MED ORDER — SODIUM CHLORIDE 0.9 % IV BOLUS
1000.0000 mL | Freq: Once | INTRAVENOUS | Status: AC
  Administered 2020-11-30: 1000 mL via INTRAVENOUS

## 2020-11-30 MED ORDER — DIPHENHYDRAMINE HCL 50 MG/ML IJ SOLN
25.0000 mg | Freq: Once | INTRAMUSCULAR | Status: AC
  Administered 2020-11-30: 25 mg via INTRAVENOUS
  Filled 2020-11-30: qty 1

## 2020-11-30 NOTE — ED Provider Notes (Signed)
[facility] EMERGENCY DEPARTMENT Provider Note   CSN: [id number] Arrival date & time: 11/30/20  0036     History Chief Complaint  Patient presents with   Allergic Reaction    Aaron Martin is a 25 y.o. male.  Patient with a history of asthma and Tourette's syndrome here with diffuse itchy rash that began about 45 minutes ago.  Feels some tightness in his throat and some shortness of breath.  Unknown allergic reaction.  Took Benadryl at home without relief.  Denies any new soaps, lotions, detergents, clothing or medications.  Takes BuSpar on a regular basis.  States he went hiking several weeks ago and had multiple chigger bites but unknown if he had a tick bite.  He had red meat today for the first time in a long time.  Never had this kind of reaction before.  Denies any chest pain.  States his tongue and throat feel normal now.  Nausea but no vomiting.  The history is provided by the patient.  Allergic Reaction Presenting symptoms: rash       Past Medical History:  Diagnosis Date   Asthma    Mild Tourette's syndrome    When Anxious   Seizures (HCC)    febrile seizures    Patient Active Problem List   Diagnosis Date Noted   Foreign body in anus and rectum     Past Surgical History:  Procedure Laterality Date   FLEXIBLE SIGMOIDOSCOPY N/A 08/17/2016   Procedure: FLEXIBLE SIGMOIDOSCOPY;  Surgeon: West Bali, MD;  Location: [facility];  Service: Endoscopy;  Laterality: N/A;  900   TONSILLECTOMY         History reviewed. No pertinent family history.  Social History   Tobacco Use   Smoking status: Never   Smokeless tobacco: Never  Vaping Use   Vaping Use: Never used  Substance Use Topics   Alcohol use: Yes    Comment: occassion   Drug use: No    Home Medications Prior to Admission medications   Medication Sig Start Date End Date Taking? Authorizing Provider  ALPRAZolam (XANAX XR) 2 MG 24 hr tablet Take 2 mg by mouth daily. 11/02/19   [provider]  busPIRone (BUSPAR) 15 MG tablet Take 15 mg by mouth at bedtime. 09/11/19   [provider]  levothyroxine (SYNTHROID) 75 MCG tablet Take 75 mcg by mouth daily. 09/07/19   [provider]    Allergies    Patient has no known allergies.  Review of Systems   Review of Systems  Constitutional:  Negative for activity change, appetite change, fatigue and fever.  HENT:  Negative for congestion and rhinorrhea.   Respiratory:  Positive for shortness of breath. Negative for cough and chest tightness.   Cardiovascular:  Negative for chest pain.  Gastrointestinal:  Negative for abdominal pain, nausea and vomiting.  Genitourinary:  Negative for dysuria, hematuria, testicular pain and urgency.  Musculoskeletal:  Negative for arthralgias and myalgias.  Skin:  Positive for rash.  Neurological:  Negative for dizziness, weakness and headaches.   all other systems are negative except as noted in the HPI and PMH.   Physical Exam Updated Vital Signs BP (!) 145/95   Pulse 64   Temp (!) 97.4 F (36.3 C) (Oral)   Resp (!) 22   Ht 6\' 3"  (1.905 m)   Wt 102.1 kg   SpO2 99%   BMI 28.12 kg/m   Physical Exam Vitals and nursing note reviewed.  Constitutional:  General: He is not in acute distress.    Appearance: He is well-developed.  HENT:     Head: Normocephalic and atraumatic.     Mouth/Throat:     Pharynx: No oropharyngeal exudate.     Comments: No tongue or lip swelling Eyes:     Conjunctiva/sclera: Conjunctivae normal.     Pupils: Pupils are equal, round, and reactive to light.  Neck:     Comments: No meningismus. Cardiovascular:     Rate and Rhythm: Normal rate and regular rhythm.     Heart sounds: Normal heart sounds. No murmur heard. Pulmonary:     Effort: Pulmonary effort is normal. No respiratory distress.     Breath sounds: Normal breath sounds. No wheezing.  Abdominal:     Palpations: Abdomen is soft.     Tenderness: There is no abdominal  tenderness. There is no guarding or rebound.  Musculoskeletal:        General: No tenderness. Normal range of motion.     Cervical back: Normal range of motion and neck supple.  Skin:    General: Skin is warm.     Findings: Rash present.     Comments: Diffuse maculopapular rash involving arms, legs, back and chest  Neurological:     Mental Status: He is alert and oriented to person, place, and time.     Cranial Nerves: No cranial nerve deficit.     Motor: No abnormal muscle tone.     Coordination: Coordination normal.     Comments:  5/5 strength throughout. CN 2-12 intact.Equal grip strength.   Psychiatric:        Behavior: Behavior normal.    ED Results / Procedures / Treatments   Labs (all labs ordered are listed, but only abnormal results are displayed) Labs Reviewed  CBC WITH DIFFERENTIAL/PLATELET - Abnormal; Notable for the following components:      Result Value   RBC 6.02 (*)    Hemoglobin 18.2 (*)    Monocytes Absolute 1.1 (*)    All other components within normal limits  BASIC METABOLIC PANEL - Abnormal; Notable for the following components:   Glucose, Bld 102 (*)    All other components within normal limits    EKG EKG Interpretation  Date/Time:  Saturday November 30 2020 00:56:05 EDT Ventricular Rate:  72 PR Interval:  155 QRS Duration: 90 QT Interval:  379 QTC Calculation: 415 R Axis:   71 Text Interpretation: Sinus rhythm RSR' in V1 or V2, probably normal variant No previous ECGs available Confirmed by Glynn Octave [phone]) on 11/30/2020 1:02:33 AM  Radiology No results found.  Procedures Procedures   Medications Ordered in ED Medications  sodium chloride 0.9 % bolus 1,000 mL (has no administration in time range)  ondansetron (ZOFRAN) injection 4 mg (has no administration in time range)  famotidine (PEPCID) IVPB 20 mg premix (has no administration in time range)  diphenhydrAMINE (BENADRYL) injection 25 mg (has no administration in time range)   methylPREDNISolone sodium succinate (SOLU-MEDROL) 125 mg/2 mL injection 125 mg (has no administration in time range)    ED Course  I have reviewed the triage vital signs and the nursing notes.  Pertinent labs & imaging results that were available during my care of the patient were reviewed by me and considered in my medical decision making (see chart for details).    MDM Rules/Calculators/A&P  Allergic reaction to unknown allergen, possibly red meat.  Patient antihistamines, IV fluids and steroids.  Patient given IV fluids as well as antihistamines and steroids.  Monitored in the ED without deterioration.  No tongue or lip swelling.  No chest pain or shortness of breath.  No hypoxia or increased work of breathing.  Rash has resolved.  Patient denies any chest pain difficulty breathing, throat swelling or tongue swelling.  Feels back to baseline.  Uncertain etiology of his allergen.  Consider possible red meat.  Discussed to avoid this for the time being.  We will give course of steroids and antihistamines.  Return precautions discussed. Final Clinical Impression(s) / ED Diagnoses Final diagnoses:  Allergic reaction, initial encounter    Rx / DC Orders ED Discharge Orders     None        Makaia Rappa, Jeannett Senior, MD 11/30/20 [phone]

## 2020-11-30 NOTE — Discharge Instructions (Addendum)
Avoid red meat for the time being.  Take the steroids as prescribed.  You may use Benadryl as needed for itching.  Return to the ED with difficulty breathing, difficulty swallowing, tongue or lip swelling or any other concerns.

## 2020-11-30 NOTE — ED Triage Notes (Addendum)
Pov from home with cc of rash / allergic reaction.  Denies shortness of breath but his "breathing feels off"  Went hiking 3 weeks ago . Was bit by chiggers unknown if he was bit by ticks. Had red meat today.

## 2020-11-30 NOTE — ED Notes (Signed)
Pt tolerating PO fluids. Dr. Manus Gunning informed.

## 2020-12-01 ENCOUNTER — Telehealth (HOSPITAL_COMMUNITY): Payer: Self-pay | Admitting: Emergency Medicine

## 2020-12-01 MED ORDER — FAMOTIDINE 20 MG PO TABS
20.0000 mg | ORAL_TABLET | Freq: Two times a day (BID) | ORAL | 0 refills | Status: DC
Start: 2020-12-01 — End: 2022-06-01

## 2020-12-01 MED ORDER — PREDNISONE 50 MG PO TABS: ORAL_TABLET | ORAL | 0 refills | Status: AC

## 2020-12-01 NOTE — Telephone Encounter (Signed)
Pharmacy updated to [facility]. Meds resent.

## 2021-05-26 DIAGNOSIS — E6609 Other obesity due to excess calories: Secondary | ICD-10-CM | POA: Diagnosis not present

## 2021-05-26 DIAGNOSIS — F988 Other specified behavioral and emotional disorders with onset usually occurring in childhood and adolescence: Secondary | ICD-10-CM | POA: Diagnosis not present

## 2021-05-26 DIAGNOSIS — E039 Hypothyroidism, unspecified: Secondary | ICD-10-CM | POA: Diagnosis not present

## 2021-05-26 DIAGNOSIS — A6922 Other neurologic disorders in Lyme disease: Secondary | ICD-10-CM | POA: Diagnosis not present

## 2021-05-26 DIAGNOSIS — Z6830 Body mass index (BMI) 30.0-30.9, adult: Secondary | ICD-10-CM | POA: Diagnosis not present

## 2021-05-26 DIAGNOSIS — F419 Anxiety disorder, unspecified: Secondary | ICD-10-CM | POA: Diagnosis not present

## 2021-07-25 DIAGNOSIS — F419 Anxiety disorder, unspecified: Secondary | ICD-10-CM | POA: Diagnosis not present

## 2021-07-25 DIAGNOSIS — F988 Other specified behavioral and emotional disorders with onset usually occurring in childhood and adolescence: Secondary | ICD-10-CM | POA: Diagnosis not present

## 2021-07-25 DIAGNOSIS — E6609 Other obesity due to excess calories: Secondary | ICD-10-CM | POA: Diagnosis not present

## 2021-07-25 DIAGNOSIS — Z6831 Body mass index (BMI) 31.0-31.9, adult: Secondary | ICD-10-CM | POA: Diagnosis not present

## 2021-07-25 DIAGNOSIS — A6922 Other neurologic disorders in Lyme disease: Secondary | ICD-10-CM | POA: Diagnosis not present

## 2021-07-28 DIAGNOSIS — F952 Tourette's disorder: Secondary | ICD-10-CM | POA: Diagnosis not present

## 2021-07-28 DIAGNOSIS — G35 Multiple sclerosis: Secondary | ICD-10-CM | POA: Diagnosis not present

## 2021-07-28 DIAGNOSIS — M79622 Pain in left upper arm: Secondary | ICD-10-CM | POA: Diagnosis not present

## 2021-07-28 DIAGNOSIS — R202 Paresthesia of skin: Secondary | ICD-10-CM | POA: Diagnosis not present

## 2021-07-29 ENCOUNTER — Other Ambulatory Visit (HOSPITAL_COMMUNITY): Payer: Self-pay | Admitting: Neurology

## 2021-07-29 ENCOUNTER — Other Ambulatory Visit: Payer: Self-pay | Admitting: Neurology

## 2021-07-29 DIAGNOSIS — G35 Multiple sclerosis: Secondary | ICD-10-CM

## 2021-08-10 ENCOUNTER — Ambulatory Visit (HOSPITAL_COMMUNITY)
Admission: RE | Admit: 2021-08-10 | Discharge: 2021-08-10 | Disposition: A | Discharge status: Home / Self Care | Payer: BC Managed Care – PPO | Source: Ambulatory Visit | Attending: Neurology | Admitting: Neurology

## 2021-08-10 DIAGNOSIS — M2578 Osteophyte, vertebrae: Secondary | ICD-10-CM | POA: Diagnosis not present

## 2021-08-10 DIAGNOSIS — G35 Multiple sclerosis: Secondary | ICD-10-CM

## 2021-08-10 DIAGNOSIS — S22009A Unspecified fracture of unspecified thoracic vertebra, initial encounter for closed fracture: Secondary | ICD-10-CM | POA: Diagnosis not present

## 2021-08-10 DIAGNOSIS — M4802 Spinal stenosis, cervical region: Secondary | ICD-10-CM | POA: Diagnosis not present

## 2021-08-10 DIAGNOSIS — M50222 Other cervical disc displacement at C5-C6 level: Secondary | ICD-10-CM | POA: Diagnosis not present

## 2021-08-10 IMAGING — MR MR HEAD WO/W CM
15 series · 48 of 48 positions shown · IV contrast (gadavist)
Comparison: Prior CT from [DATE].

CLINICAL DATA: Initial evaluation for multiple sclerosis.

EXAM:
MRI HEAD WITHOUT AND WITH CONTRAST
TECHNIQUE: Multiplanar, multiecho pulse sequences of the brain and surrounding
structures were obtained without and with intravenous contrast.
CONTRAST:  10mL GADAVIST GADOBUTROL 1 MMOL/ML IV SOLN

[Series 5: DWI · axial · 3.0mm · 1.36mm/px · z∈[-58,+89]mm · 6 of 100 slices shown (1 of 4)]
[im 1/100]
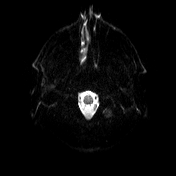
[im 20/100]
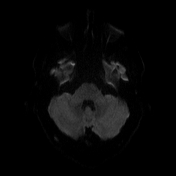
[im 40/100]
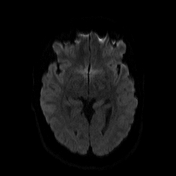
[im 60/100]
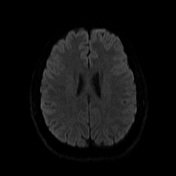
[im 80/100]
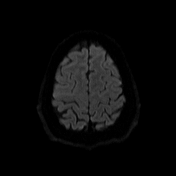
[im 100/100]
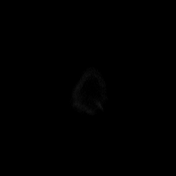

[Series 6: DWI · axial · 3.0mm · 1.36mm/px · z∈[-58,+89]mm · 3 of 49 slices shown (2 of 4)]
[im 1/49]
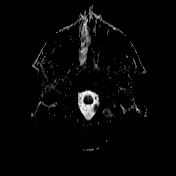
[im 25/49]
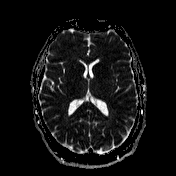
[im 49/49]
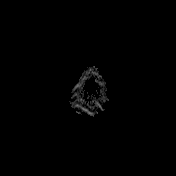

[Series 7: T1 · sagittal · 5.0mm · 0.75mm/px · 1 of 24 slices shown (1 of 2)]
[im 1/24]
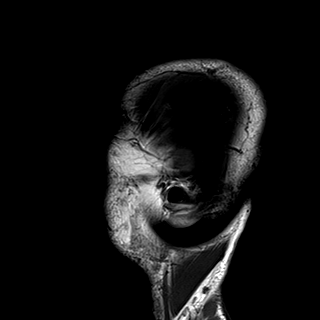

[Series 8: FLAIR · sagittal · 1.1mm · 0.50mm/px · 6 of 128 slices shown (1 of 2)]
[im 1/128]
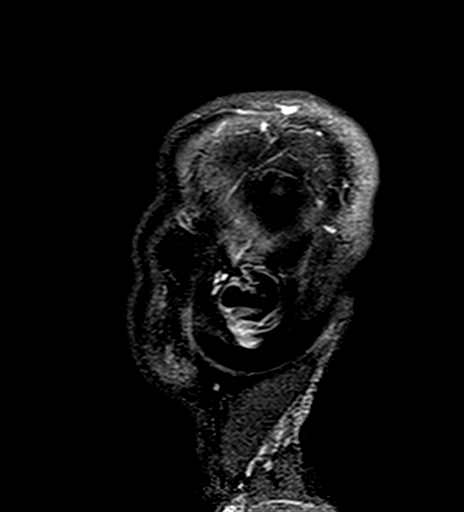
[im 26/128]
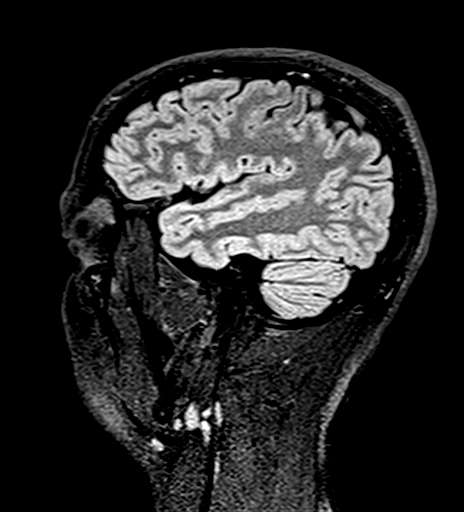
[im 51/128]
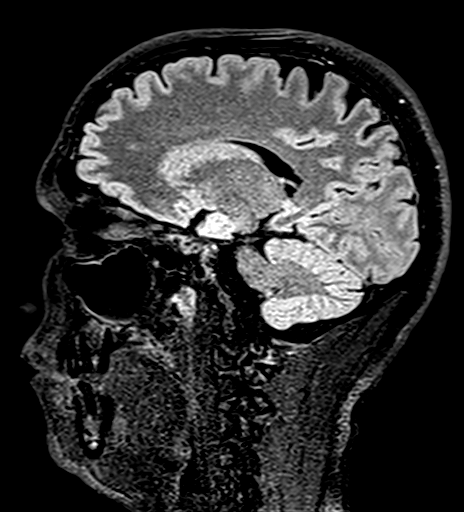
[im 77/128]
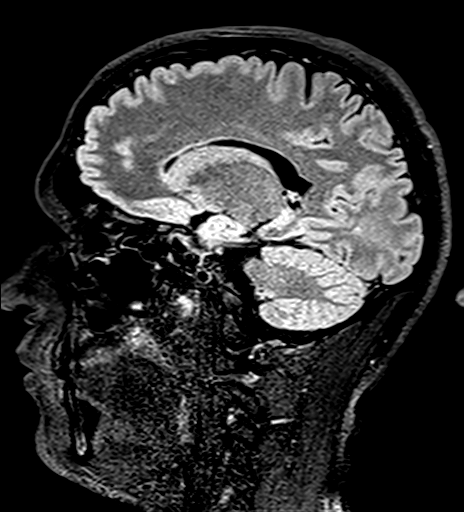
[im 102/128]
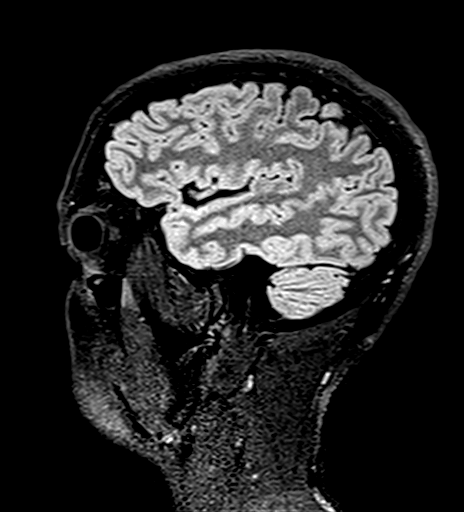
[im 128/128]
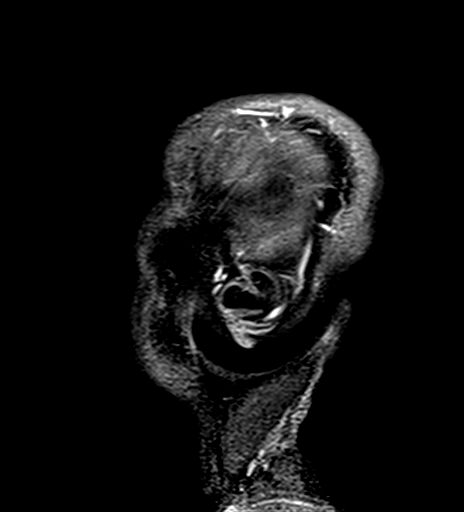

[Series 9: T2 · sagittal · 1.5mm · 1.46mm/px · 4 of 96 slices shown (1 of 2)]
[im 1/96]
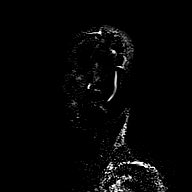
[im 32/96]
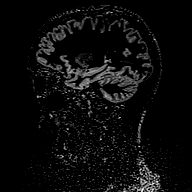
[im 64/96]
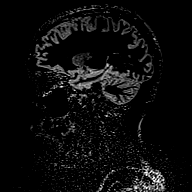
[im 96/96]
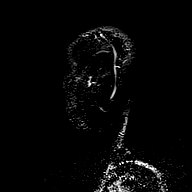

[Series 10: T2 · axial · 5.0mm · 0.62mm/px · 1 of 25 slices shown (2 of 2)]
[im 1/25]
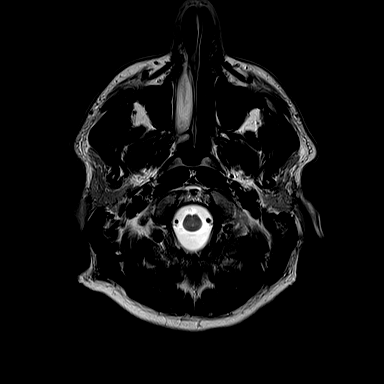

[Series 11: swi_images · axial · 3.0mm · 0.75mm/px · z∈[-91,+122]mm · 3 of 72 slices shown]
[im 1/72]
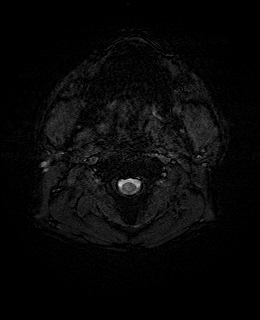
[im 36/72]
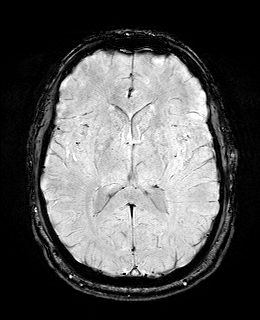
[im 72/72]
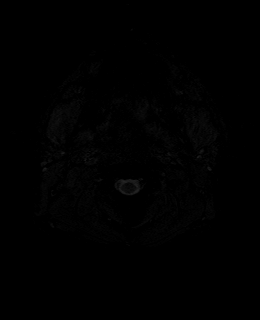

[Series 13: FLAIR · axial · 3.0mm · 0.75mm/px · z∈[-55,+86]mm · 2 of 48 slices shown (2 of 2)]
[im 1/48]
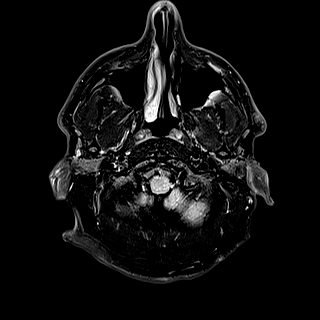
[im 48/48]
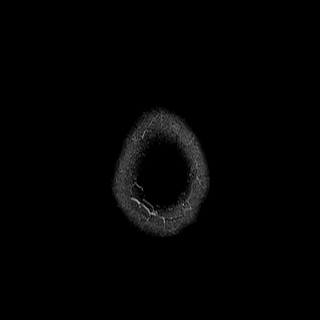

[Series 14: T1 · axial · 1.0mm · 0.94mm/px · z∈[-68,+91]mm · 7 of 160 slices shown (2 of 2)]
[im 1/160]
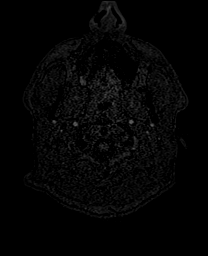
[im 27/160]
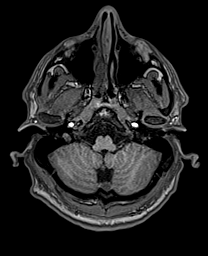
[im 54/160]
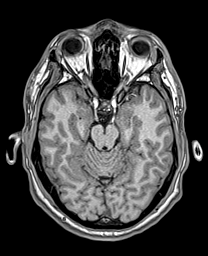
[im 80/160]
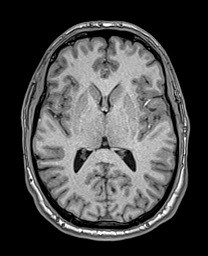
[im 107/160]
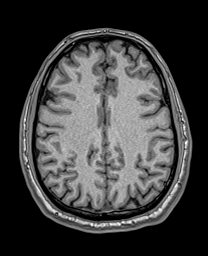
[im 133/160]
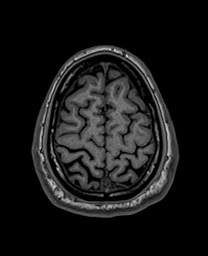
[im 160/160]
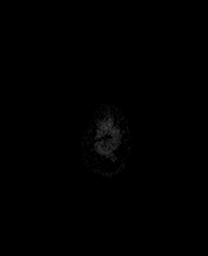

[Series 15: DWI · coronal · 5.0mm · 1.31mm/px · 3 of 76 slices shown (3 of 4)]
[im 1/76]
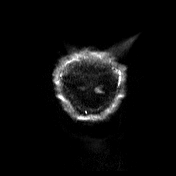
[im 38/76]
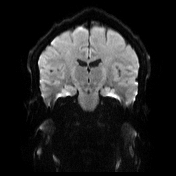
[im 76/76]
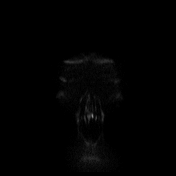

[Series 16: DWI · coronal · 5.0mm · 1.31mm/px · 2 of 38 slices shown (4 of 4)]
[im 1/38]
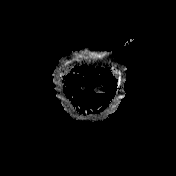
[im 38/38]
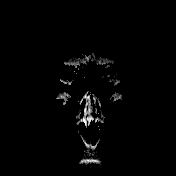

[Series 17: T2 post-contrast · coronal · 5.0mm · 0.57mm/px · 1 of 30 slices shown]
[im 1/30]
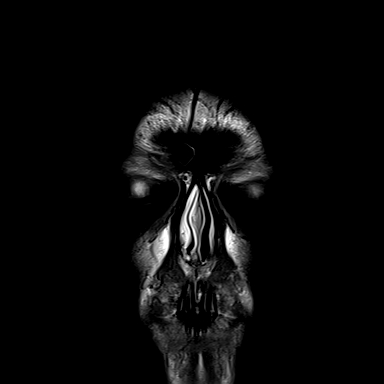

[Series 18: T1 post-contrast · axial · 1.0mm · 0.94mm/px · z∈[-64,+95]mm · 7 of 160 slices shown (1 of 3)]
[im 1/160]
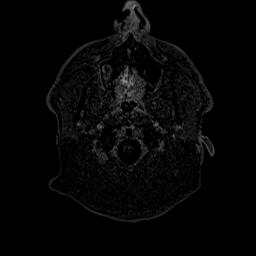
[im 27/160]
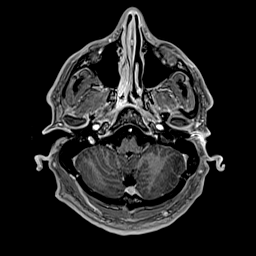
[im 54/160]
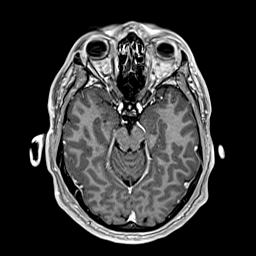
[im 80/160]
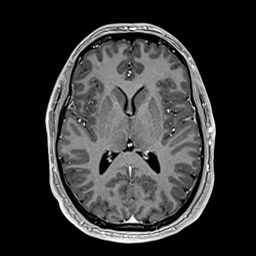
[im 107/160]
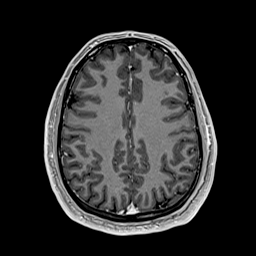
[im 133/160]
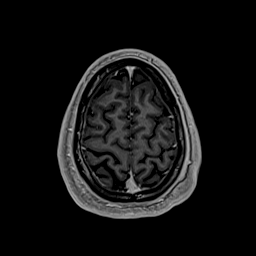
[im 160/160]
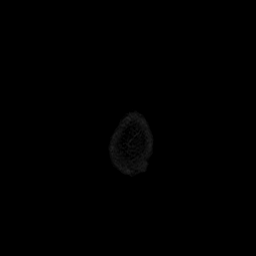

[Series 19: T1 post-contrast · coronal · 5.0mm · 0.43mm/px · 1 of 30 slices shown (2 of 3)]
[im 1/30]
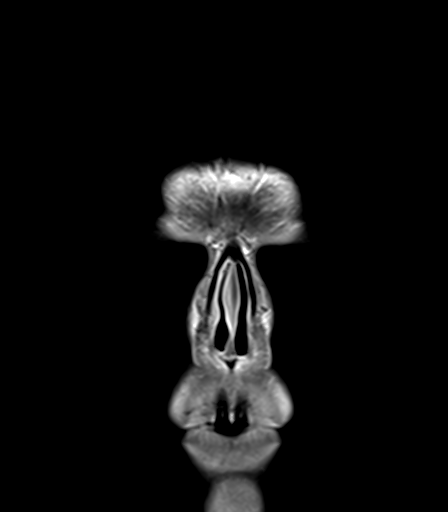

[Series 20: T1 post-contrast · sagittal · 5.0mm · 0.75mm/px · 1 of 24 slices shown (3 of 3)]
[im 1/24]
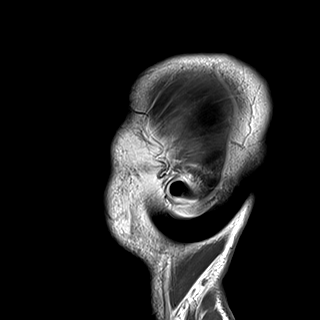

[48 of 48 positions shown; findings below may reference images not displayed]

FINDINGS: Brain: Cerebral volume within normal limits. Single punctate focus
of FLAIR signal abnormality seen involving the posterior right
periventricular w white matter (series 8, image 43). Additional
punctate focus of FLAIR signal abnormality seen involving the
juxtacortical white matter of the right frontal lobe (series 8,
image 21). These are nonspecific. No other significant cerebral
white matter disease or other focal parenchymal signal abnormality.

No evidence for acute or subacute infarct. Gray-white matter
differentiation maintained. No areas of chronic cortical infarction.
No acute or chronic intracranial blood products.

No mass lesion, midline shift or mass effect. No hydrocephalus or
extra-axial fluid collection. Pituitary gland suprasellar region
normal. Midline structures intact and normally formed.

No abnormal enhancement.

Vascular: Major intracranial vascular flow voids are well
maintained.

Skull and upper cervical spine: Craniocervical junction within
normal limits. Bone marrow signal intensity normal. No scalp soft
tissue abnormality.

Sinuses/Orbits: Globes and orbital soft tissues within normal
limits. Mild scattered mucosal thickening noted about the ethmoidal
air cells and maxillary sinuses. Paranasal sinuses are otherwise
clear. No significant mastoid effusion. Inner ear structures grossly
normal.

Other: None.
IMPRESSION: 1. Two punctate foci of FLAIR signal abnormality involving the
posterior right periventricular and juxtacortical white matter of
the right frontal lobe as above, nonspecific. No abnormal
enhancement.
2. Otherwise normal brain MRI. No other acute intracranial
abnormality.

## 2021-08-10 IMAGING — MR MR CERVICAL SPINE WO/W CM
8 of 9 series · 34 of 48 positions shown · IV contrast (gadavist)
Comparison: Prior CT from [DATE].

CLINICAL DATA: Initial evaluation for multiple sclerosis.

EXAM:
MRI CERVICAL SPINE WITHOUT AND WITH CONTRAST
TECHNIQUE: Multiplanar and multiecho pulse sequences of the cervical spine, to
include the craniocervical junction and cervicothoracic junction,
were obtained without and with intravenous contrast.
CONTRAST:  10mL GADAVIST GADOBUTROL 1 MMOL/ML IV SOLN

[Series 5: T1 · sagittal · 3.0mm · 0.69mm/px · 4 of 16 slices shown (1 of 2)]
[im 1/16]
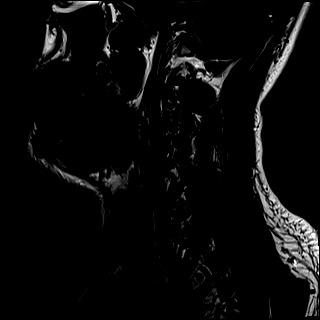
[im 6/16]
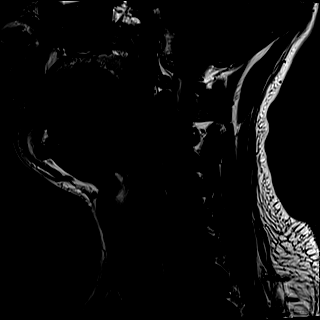
[im 11/16]
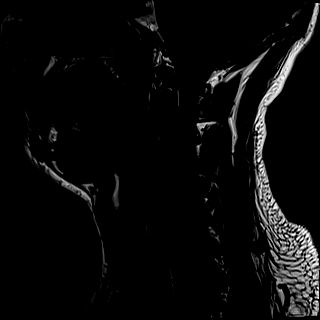
[im 16/16]
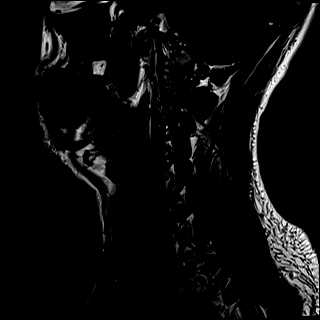

[Series 6: T2 · sagittal · 3.0mm · 0.69mm/px · 3 of 16 slices shown (1 of 2)]
[im 1/16]
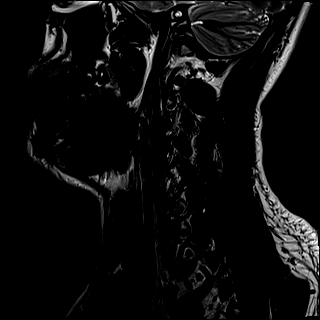
[im 8/16]
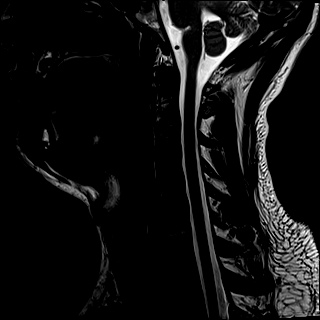
[im 16/16]
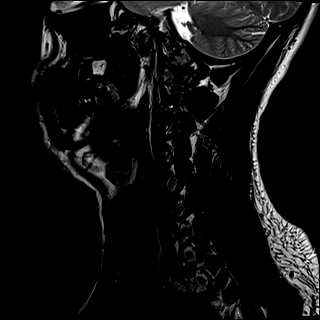

[Series 7: STIR · sagittal · 3.0mm · 0.86mm/px · 3 of 16 slices shown]
[im 1/16]
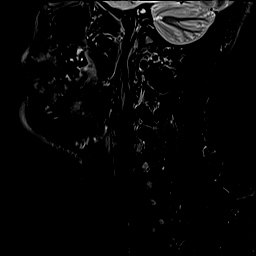
[im 8/16]
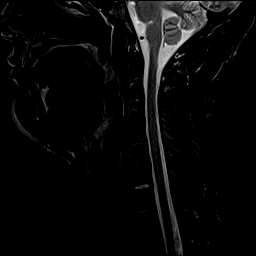
[im 16/16]
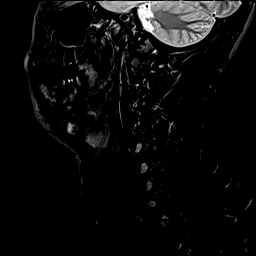

[Series 8: T2 · axial · 3.0mm · 0.70mm/px · z∈[-221,-87]mm · 8 of 40 slices shown (2 of 2)]
[im 1/40]
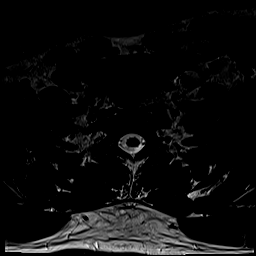
[im 6/40]
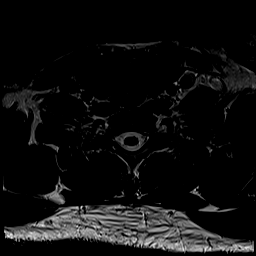
[im 12/40]
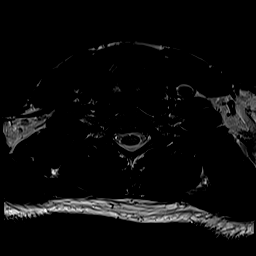
[im 17/40]
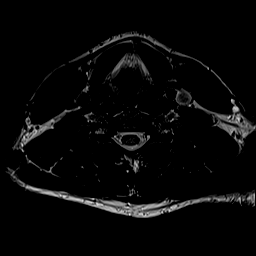
[im 23/40]
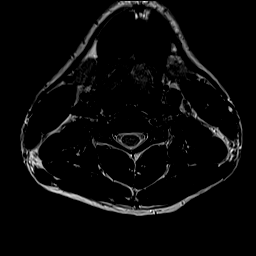
[im 28/40]
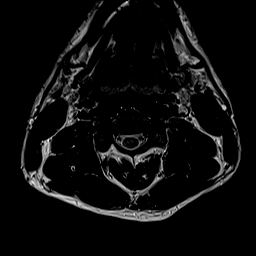
[im 34/40]
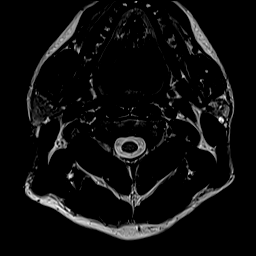
[im 40/40]
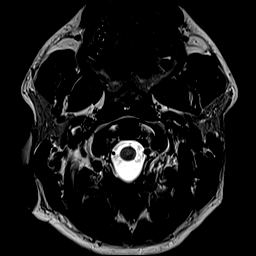

[Series 10: T1 · axial · 3.0mm · 0.35mm/px · z∈[-221,-87]mm · 8 of 40 slices shown (2 of 2)]
[im 1/40]
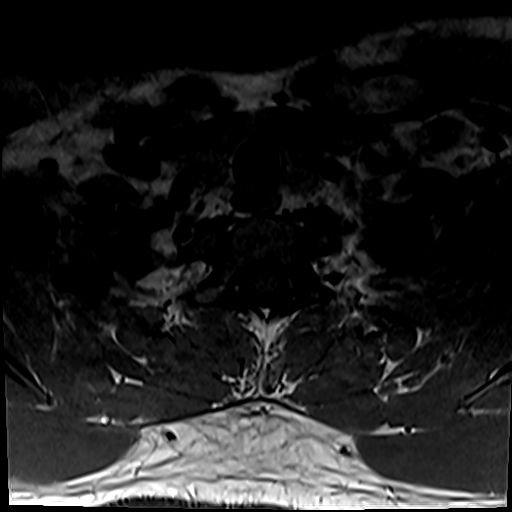
[im 6/40]
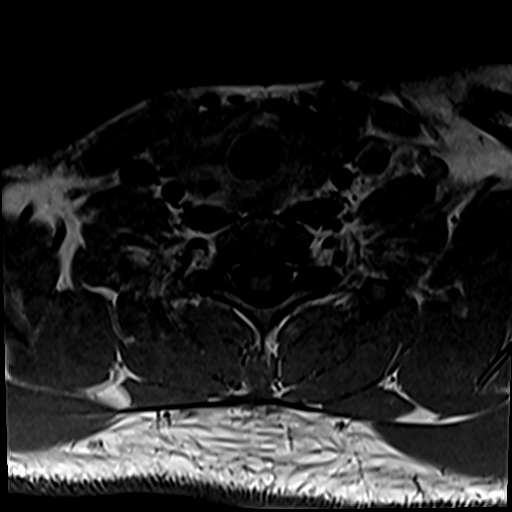
[im 12/40]
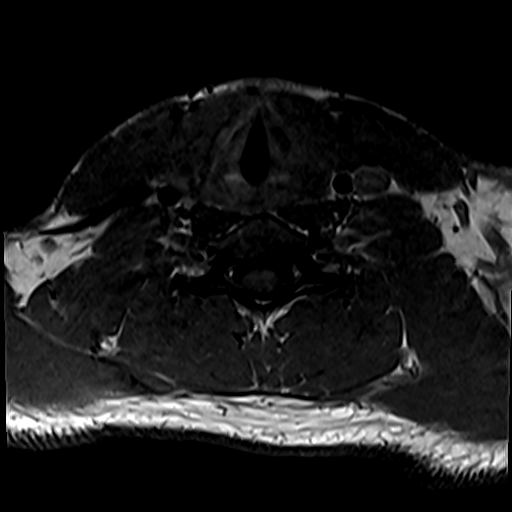
[im 17/40]
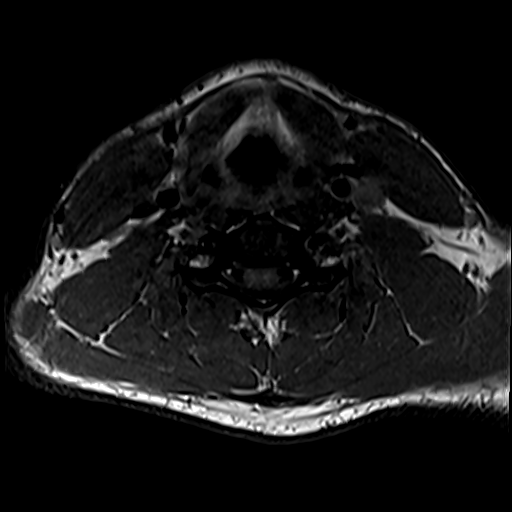
[im 23/40]
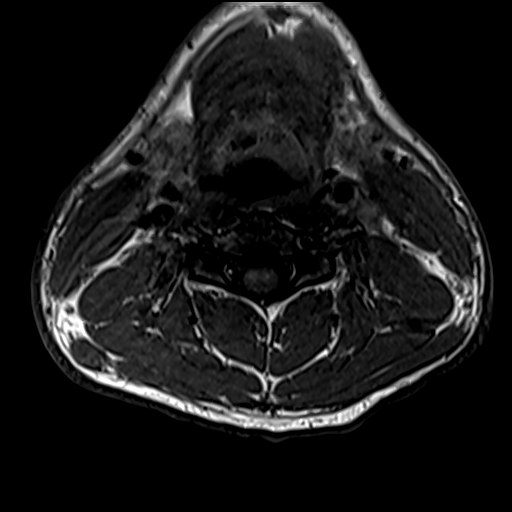
[im 28/40]
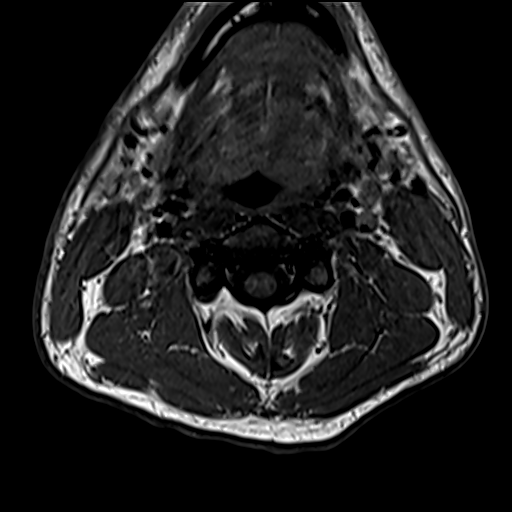
[im 34/40]
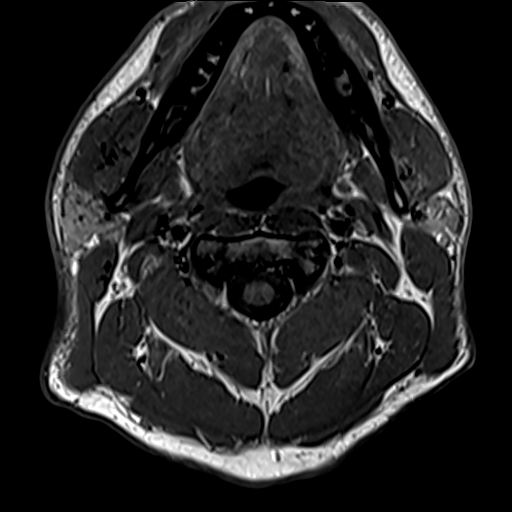
[im 40/40]
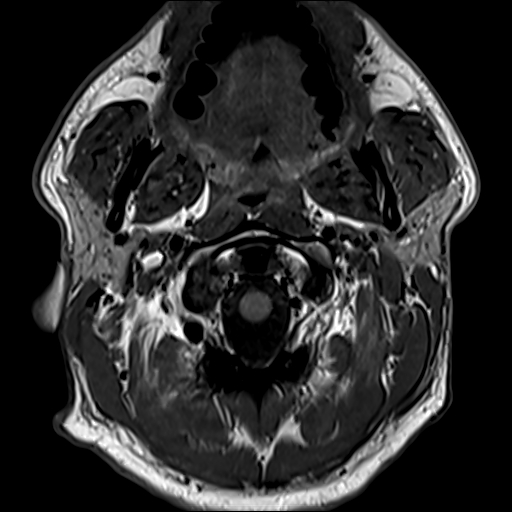

[Series 11: T2 post-contrast · sagittal · 3.0mm · 0.69mm/px · 3 of 16 slices shown]
[im 1/16]
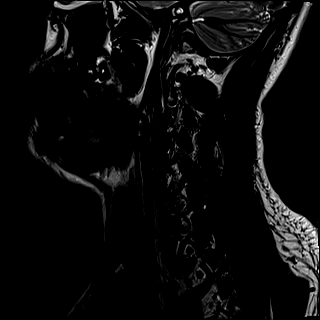
[im 8/16]
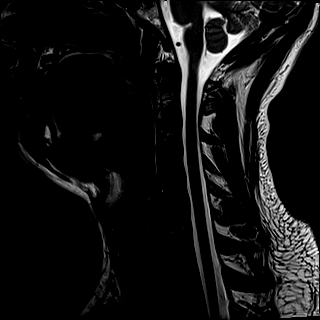
[im 16/16]
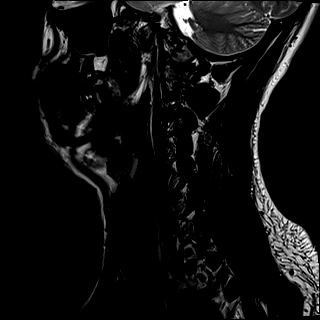

[Series 12: T1 fat-sat post-contrast · sagittal · 3.0mm · 0.69mm/px · 3 of 16 slices shown]
[im 1/16]
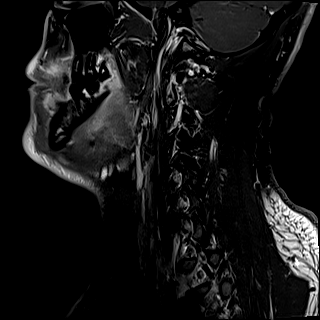
[im 8/16]
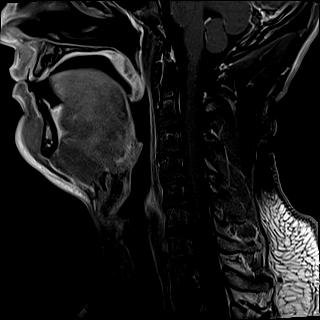
[im 16/16]
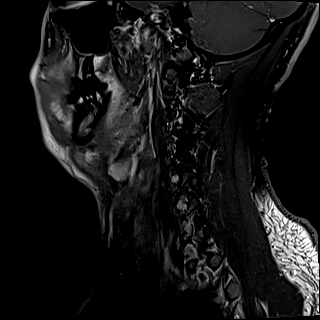

[Series 14: T1 post-contrast · axial · 3.0mm · 0.35mm/px · z∈[-221,-203]mm · 2 of 40 slices shown]
[im 1/40]
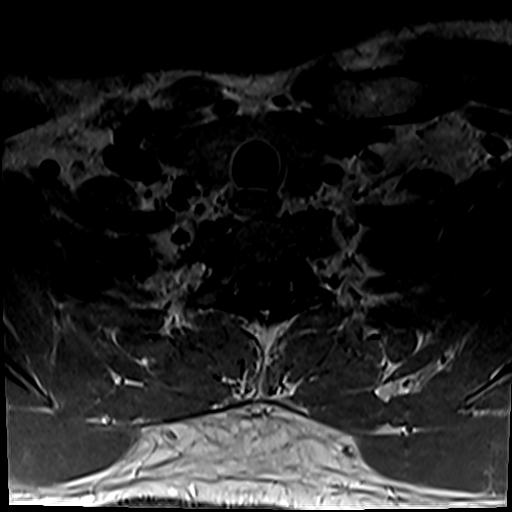
[im 6/40]
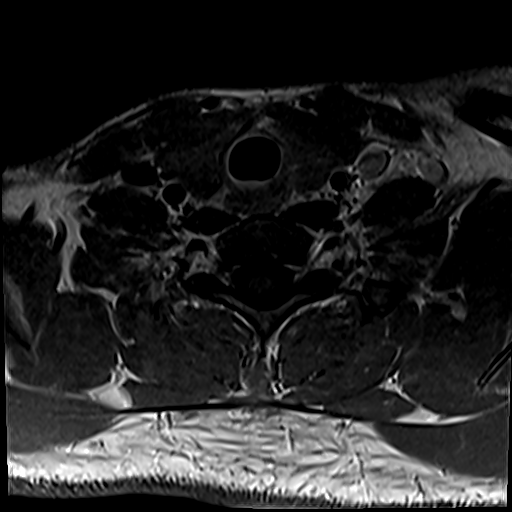

[34 of 48 positions shown; findings below may reference images not displayed]

FINDINGS: Alignment: Straightening of the normal cervical lordosis. No
listhesis.

Vertebrae: Vertebral body height well maintained without acute or
chronic fracture. Bone marrow signal intensity within normal limits.
No discrete or worrisome osseous lesions. No abnormal marrow edema
or enhancement.

Cord: Normal signal and morphology. No cord signal changes to
suggest demyelinating disease. No abnormal enhancement.

Posterior Fossa, vertebral arteries, paraspinal tissues:
Unremarkable.

Disc levels:

C2-C3: Unremarkable.

C3-C4:  Unremarkable.

C4-C5:  Unremarkable.

C5-C6: Minimal disc bulge with small right paracentral annular
fissure. No spinal stenosis. Foramina remain patent.

C6-C7:  Unremarkable.

C7-T1: Left foraminal disc osteophyte complex (series 6, image 13).
Resultant mild to moderate left C8 foraminal narrowing. Right neural
foramen remains patent. No spinal stenosis.

Visualized upper thoracic spine demonstrates no significant finding.
IMPRESSION: 1. Normal MRI of the cervical spinal cord. No abnormal enhancement
or evidence for demyelinating disease.
2. Left foraminal disc osteophyte complex at C7-T1 with resultant
mild to moderate left C8 foraminal stenosis.
3. Mild noncompressive disc bulging at C5-6 without stenosis.

## 2021-08-10 MED ORDER — GADOBUTROL 1 MMOL/ML IV SOLN
10.0000 mL | Freq: Once | INTRAVENOUS | Status: AC | PRN
  Administered 2021-08-10: 10 mL via INTRAVENOUS

## 2021-09-01 ENCOUNTER — Other Ambulatory Visit (HOSPITAL_COMMUNITY): Payer: Self-pay

## 2021-09-01 ENCOUNTER — Inpatient Hospital Stay (HOSPITAL_COMMUNITY): Admission: RE | Admit: 2021-09-01 | Payer: Self-pay | Source: Ambulatory Visit

## 2021-09-07 ENCOUNTER — Other Ambulatory Visit (HOSPITAL_COMMUNITY): Payer: Self-pay

## 2021-09-22 DIAGNOSIS — M79622 Pain in left upper arm: Secondary | ICD-10-CM | POA: Diagnosis not present

## 2021-09-22 DIAGNOSIS — R202 Paresthesia of skin: Secondary | ICD-10-CM | POA: Diagnosis not present

## 2021-09-22 DIAGNOSIS — F952 Tourette's disorder: Secondary | ICD-10-CM | POA: Diagnosis not present

## 2021-10-06 DIAGNOSIS — F419 Anxiety disorder, unspecified: Secondary | ICD-10-CM | POA: Diagnosis not present

## 2021-10-06 DIAGNOSIS — F988 Other specified behavioral and emotional disorders with onset usually occurring in childhood and adolescence: Secondary | ICD-10-CM | POA: Diagnosis not present

## 2021-10-06 DIAGNOSIS — A6922 Other neurologic disorders in Lyme disease: Secondary | ICD-10-CM | POA: Diagnosis not present

## 2021-12-12 DIAGNOSIS — F988 Other specified behavioral and emotional disorders with onset usually occurring in childhood and adolescence: Secondary | ICD-10-CM | POA: Diagnosis not present

## 2021-12-12 DIAGNOSIS — F419 Anxiety disorder, unspecified: Secondary | ICD-10-CM | POA: Diagnosis not present

## 2022-02-09 DIAGNOSIS — A6922 Other neurologic disorders in Lyme disease: Secondary | ICD-10-CM | POA: Diagnosis not present

## 2022-02-09 DIAGNOSIS — F988 Other specified behavioral and emotional disorders with onset usually occurring in childhood and adolescence: Secondary | ICD-10-CM | POA: Diagnosis not present

## 2022-02-09 DIAGNOSIS — Z0001 Encounter for general adult medical examination with abnormal findings: Secondary | ICD-10-CM | POA: Diagnosis not present

## 2022-02-09 DIAGNOSIS — Z6834 Body mass index (BMI) 34.0-34.9, adult: Secondary | ICD-10-CM | POA: Diagnosis not present

## 2022-02-09 DIAGNOSIS — Z1331 Encounter for screening for depression: Secondary | ICD-10-CM | POA: Diagnosis not present

## 2022-02-09 DIAGNOSIS — E039 Hypothyroidism, unspecified: Secondary | ICD-10-CM | POA: Diagnosis not present

## 2022-03-03 ENCOUNTER — Encounter: Payer: Self-pay | Admitting: Neurology

## 2022-03-03 DIAGNOSIS — R202 Paresthesia of skin: Secondary | ICD-10-CM | POA: Diagnosis not present

## 2022-03-03 DIAGNOSIS — F952 Tourette's disorder: Secondary | ICD-10-CM | POA: Diagnosis not present

## 2022-03-03 DIAGNOSIS — M79622 Pain in left upper arm: Secondary | ICD-10-CM | POA: Diagnosis not present

## 2022-03-03 DIAGNOSIS — G603 Idiopathic progressive neuropathy: Secondary | ICD-10-CM | POA: Diagnosis not present

## 2022-03-26 DIAGNOSIS — F988 Other specified behavioral and emotional disorders with onset usually occurring in childhood and adolescence: Secondary | ICD-10-CM | POA: Diagnosis not present

## 2022-03-26 DIAGNOSIS — A6922 Other neurologic disorders in Lyme disease: Secondary | ICD-10-CM | POA: Diagnosis not present

## 2022-04-16 ENCOUNTER — Encounter (HOSPITAL_COMMUNITY): Payer: Self-pay | Admitting: Emergency Medicine

## 2022-04-16 ENCOUNTER — Emergency Department (HOSPITAL_COMMUNITY): Payer: BC Managed Care – PPO

## 2022-04-16 ENCOUNTER — Emergency Department (HOSPITAL_COMMUNITY)
Admission: EM | Admit: 2022-04-16 | Discharge: 2022-04-16 | Disposition: A | Discharge status: Home / Self Care | Payer: BC Managed Care – PPO | Attending: Emergency Medicine | Admitting: Emergency Medicine

## 2022-04-16 ENCOUNTER — Other Ambulatory Visit: Payer: Self-pay

## 2022-04-16 DIAGNOSIS — I82401 Acute embolism and thrombosis of unspecified deep veins of right lower extremity: Secondary | ICD-10-CM | POA: Diagnosis not present

## 2022-04-16 DIAGNOSIS — I82451 Acute embolism and thrombosis of right peroneal vein: Secondary | ICD-10-CM | POA: Diagnosis not present

## 2022-04-16 DIAGNOSIS — M79605 Pain in left leg: Secondary | ICD-10-CM | POA: Diagnosis not present

## 2022-04-16 DIAGNOSIS — Z7901 Long term (current) use of anticoagulants: Secondary | ICD-10-CM | POA: Diagnosis not present

## 2022-04-16 DIAGNOSIS — M7989 Other specified soft tissue disorders: Secondary | ICD-10-CM | POA: Diagnosis not present

## 2022-04-16 DIAGNOSIS — M79604 Pain in right leg: Secondary | ICD-10-CM | POA: Diagnosis not present

## 2022-04-16 DIAGNOSIS — J45909 Unspecified asthma, uncomplicated: Secondary | ICD-10-CM | POA: Diagnosis not present

## 2022-04-16 HISTORY — DX: Allergy to other foods: Z91.018

## 2022-04-16 LAB — CBC WITH DIFFERENTIAL/PLATELET
Abs Immature Granulocytes: 0.02 10*3/uL (ref 0.00–0.07)
Basophils Absolute: 0 10*3/uL (ref 0.0–0.1)
Basophils Relative: 1 %
Eosinophils Absolute: 0.1 10*3/uL (ref 0.0–0.5)
Eosinophils Relative: 2 %
HCT: 45.8 % (ref 39.0–52.0)
Hemoglobin: 15.6 g/dL (ref 13.0–17.0)
Immature Granulocytes: 0 %
Lymphocytes Relative: 23 %
Lymphs Abs: 1.2 10*3/uL (ref 0.7–4.0)
MCH: 28.5 pg (ref 26.0–34.0)
MCHC: 34.1 g/dL (ref 30.0–36.0)
MCV: 83.7 fL (ref 80.0–100.0)
Monocytes Absolute: 0.6 10*3/uL (ref 0.1–1.0)
Monocytes Relative: 11 %
Neutro Abs: 3.4 10*3/uL (ref 1.7–7.7)
Neutrophils Relative %: 63 %
Platelets: 220 10*3/uL (ref 150–400)
RBC: 5.47 MIL/uL (ref 4.22–5.81)
RDW: 13.1 % (ref 11.5–15.5)
WBC: 5.3 10*3/uL (ref 4.0–10.5)
nRBC: 0 % (ref 0.0–0.2)

## 2022-04-16 LAB — BASIC METABOLIC PANEL
Anion gap: 9 (ref 5–15)
BUN: 10 mg/dL (ref 6–20)
CO2: 28 mmol/L (ref 22–32)
Calcium: 8.9 mg/dL (ref 8.9–10.3)
Chloride: 100 mmol/L (ref 98–111)
Creatinine, Ser: 1.02 mg/dL (ref 0.61–1.24)
GFR, Estimated: >60 mL/min (ref >60)
Glucose, Bld: 94 mg/dL (ref 70–99)
Potassium: 4.3 mmol/L (ref 3.5–5.1)
Sodium: 137 mmol/L (ref 135–145)

## 2022-04-16 MED ORDER — APIXABAN (ELIQUIS) VTE STARTER PACK (10MG AND 5MG): ORAL_TABLET | ORAL | 0 refills | Status: AC

## 2022-04-16 NOTE — Discharge Instructions (Addendum)
You have been seen today for your complaint of bilateral lower extremity pain. Your lab work was reassuring and showed no abnormalities. Your imaging showed that you have a blood clot in your right leg. Your discharge medications include Eliquis.  Take as directed on the package. Follow up with: Your primary care provider in 1 week Please seek immediate medical care if you develop any of the following symptoms: You have chest pain or shortness of breath. You cough up blood. You have a rapid or irregular heartbeat. You feel light-headed or dizzy. You have new or increased pain, swelling, or redness in an arm or leg. You have numbness or tingling in an arm or leg. You are on blood thinning medicines and have a sudden severe headache or blood in your vomit, stool, or urine. At this time there does not appear to be the presence of an emergent medical condition, however there is always the potential for conditions to change. Please read and follow the below instructions.  Do not take your medicine if  develop an itchy rash, swelling in your mouth or lips, or difficulty breathing; call 911 and seek immediate emergency medical attention if this occurs.  You may review your lab tests and imaging results in their entirety on your MyChart account.  Please discuss all results of fully with your primary care provider and other specialist at your follow-up visit.  Note: Portions of this text may have been transcribed using voice recognition software. Every effort was made to ensure accuracy; however, inadvertent computerized transcription errors may still be present.

## 2022-04-16 NOTE — ED Provider Notes (Signed)
[name] EMERGENCY DEPARTMENT Provider Note   CSN: [id number] Arrival date & time: 04/16/22  1030     History  Chief Complaint  Patient presents with   Leg Pain    Aaron Martin is a 27 y.o. male.  With history of seizures, Tourette's, asthma, allergy to alpha gal who presents to the ED for evaluation of bilateral lower extremity pain.  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  He states he also began to feel left foot swelling.  When he got home and took his shoes off, he noticed that his left foot was swollen and very erythematous.  He states it was painful at that time as well.  He slept with his legs elevated, and noticed that he had some pain in the right lower extremity that was similar to the pain in the left lower extremity.  His swelling and erythema did decrease in the left lower extremity, however has remained the same and his toes.  The pain in the right lower extremity is mostly in the calf.  He states he now feels unbalanced when he walks like he cannot control his feet.  He has a difficult time coming to a stop when walking.  Denies history of DVT, recent illness, fevers, chills, chest pain, shortness of breath, numbness, weakness, tingling.  Has a history of cervical radiculopathy and states that it does not feel like that.  Denies back pain.   Leg Pain      Home Medications Prior to Admission medications   Medication Sig Start Date End Date Taking? Authorizing Provider  APIXABAN (ELIQUIS) VTE STARTER PACK (10MG  AND 5MG ) Take as directed on package: start with two-5mg  tablets twice daily for 7 days. On day 8, switch to one-5mg  tablet twice daily. 04/16/22  Yes Wendee Hata, , PA-C  ALPRAZolam (XANAX XR) 2 MG 24 hr tablet Take 2 mg by mouth daily. 11/02/19   [provider]  busPIRone (BUSPAR) 15 MG tablet Take 15 mg by mouth at bedtime. 09/11/19   [provider]  famotidine (PEPCID) 20  MG tablet Take 1 tablet (20 mg total) by mouth 2 (two) times daily. 11/30/20   Rancour, 09/13/19, MD  famotidine (PEPCID) 20 MG tablet Take 1 tablet (20 mg total) by mouth 2 (two) times daily. 12/01/20   Rancour, Jeannett Senior, MD  levothyroxine (SYNTHROID) 75 MCG tablet Take 75 mcg by mouth daily. 09/07/19   [provider]  predniSONE (DELTASONE) 50 MG tablet 1 tablet PO daily 11/30/20   Rancour, 11/07/19, MD  predniSONE (DELTASONE) 50 MG tablet 1 tablet PO daily 12/01/20   Jeannett Senior, MD      Allergies    Patient has no known allergies.    Review of Systems   Review of Systems  Musculoskeletal:  Positive for joint swelling and myalgias.  All other systems reviewed and are negative.   Physical Exam Updated Vital Signs BP 132/82 (BP Location: Right Arm)   Pulse 81   Temp 98.8 F (37.1 C) (Oral)   Resp 16   Ht 6' (1.829 m)   Wt 120.2 kg   SpO2 100%   BMI 35.94 kg/m  Physical Exam Vitals and nursing note reviewed.  Constitutional:      General: He is not in acute distress.    Appearance: Normal appearance. He is well-developed. He is not ill-appearing, toxic-appearing or diaphoretic.  HENT:     Head:  Normocephalic and atraumatic.  Eyes:     Conjunctiva/sclera: Conjunctivae normal.  Cardiovascular:     Rate and Rhythm: Normal rate and regular rhythm.     Heart sounds: No murmur heard. Pulmonary:     Effort: Pulmonary effort is normal. No respiratory distress.     Breath sounds: Normal breath sounds. No stridor. No wheezing, rhonchi or rales.  Abdominal:     Palpations: Abdomen is soft.     Tenderness: There is no abdominal tenderness.  Musculoskeletal:        General: Tenderness (TTP of the left toes, right posterior calf, right posterior calf) present. No swelling.     Cervical back: Neck supple.  Skin:    General: Skin is warm and dry.     Capillary Refill: Capillary refill takes less than 2 seconds.     Findings: Erythema (Left toes without extension into the foot)  present.  Neurological:     General: No focal deficit present.     Mental Status: He is alert and oriented to person, place, and time.  Psychiatric:        Mood and Affect: Mood normal.     ED Results / Procedures / Treatments   Labs (all labs ordered are listed, but only abnormal results are displayed) Labs Reviewed  BASIC METABOLIC PANEL  CBC WITH DIFFERENTIAL/PLATELET    EKG None  Radiology US Venous Img Lower Bilateral  Result Date: 04/16/2022 CLINICAL DATA:  swelling, erythema EXAM: BILATERAL LOWER EXTREMITY VENOUS DOPPLER ULTRASOUND TECHNIQUE: Gray-scale sonography with graded compression, as well as color Doppler and duplex ultrasound were performed to evaluate the lower extremity deep venous systems from the level of the common femoral vein and including the common femoral, femoral, profunda femoral, popliteal and calf veins including the posterior tibial, peroneal and gastrocnemius veins when visible. The superficial great saphenous vein was also interrogated. Spectral Doppler was utilized to evaluate flow at rest and with distal augmentation maneuvers in the common femoral, femoral and popliteal veins. COMPARISON:  CT AP 08/17/2016. FINDINGS: RIGHT LOWER EXTREMITY VENOUS Normal compressibility of the RIGHT common femoral, superficial femoral, and popliteal veins. Visualized portions of profunda femoral vein and great saphenous vein unremarkable. Heterogeneously echogenic filling defect with noncompressibility of 1 of the paired RIGHT peroneal veins. OTHER No evidence of superficial thrombophlebitis or abnormal fluid collection. Limitations: none LEFT LOWER EXTREMITY VENOUS Normal compressibility of the LEFT common femoral, superficial femoral, and popliteal veins, as well as the visualized calf veins. Visualized portions of profunda femoral vein and great saphenous vein unremarkable. No filling defects to suggest DVT on grayscale or color Doppler imaging. Doppler waveforms show  normal direction of venous flow, normal respiratory plasticity and response to augmentation. OTHER No evidence of superficial thrombophlebitis or abnormal fluid collection. Limitations: none IMPRESSION: 1. Examination is POSITIVE for DVT within the RIGHT peroneal vein. 2. No evidence of femoropopliteal DVT or superficial thrombophlebitis within the LEFT lower extremity. Michaelle Birks, MD [facility] Radiology Electronically Signed   By: Michaelle Birks M.D.   On: 04/16/2022 15:12    Procedures Procedures    Medications Ordered in ED Medications - No data to display  ED Course/ Medical Decision Making/ A&P                             Medical Decision Making Amount and/or Complexity of Data Reviewed Labs: ordered.  Risk Prescription drug management.  This patient presents to  the ED for concern of bilateral lower extremity pain, this involves an extensive number of treatment options, and is a complaint that carries with it a high risk of complications and morbidity.  The differential diagnosis includes DVT, cellulitis, muscle strain  My initial workup includes basic labs, bilateral DVT study  Additional history obtained from: Nursing notes from this visit. Family mother provides a portion of the history  I ordered, reviewed and interpreted labs which include: BMP, CBC.  Labs within normal limits  I ordered imaging studies including bilateral DVT study I independently visualized and interpreted imaging which showed positive for DVT in the right peroneal vein I agree with the radiologist interpretation  Afebrile, hemodynamically stable.  26 year old male presents to the ED for evaluation of bilateral lower extremity pain that began yesterday after work.  Physical exam is significant for erythema of the left toes and tenderness to palpation of bilateral calves.  DVT study was positive for DVT in the right peroneal vein.  He denies chest pain,  shortness of breath or hemoptysis.  Low suspicion for PE at this time.  He states he was exposed to the cold weather for an extended Period of time yesterday while only wearing tennis shoes at work.  The toe erythema on his left foot may be secondary to chilblains.  He was encouraged to return to the ED if he develops fevers or if the redness increases.  He will be started on Eliquis VTE starter pack.  He was encouraged to follow-up with his primary care provider for reassessment next week.  He was given return precautions including symptoms of PE.  Stable at discharge.  At this time there does not appear to be any evidence of an acute emergency medical condition and the patient appears stable for discharge with appropriate outpatient follow up. Diagnosis was discussed with patient who verbalizes understanding of care plan and is agreeable to discharge. I have discussed return precautions with patient and mother who verbalizes understanding. Patient encouraged to follow-up with their PCP within 1 week. All questions answered.  Patient's case discussed with Dr. Truett Mainland who agrees with plan to discharge with follow-up.   Note: Portions of this report may have been transcribed using voice recognition software. Every effort was made to ensure accuracy; however, inadvertent computerized transcription errors may still be present.          Final Clinical Impression(s) / ED Diagnoses Final diagnoses:  Acute deep vein thrombosis (DVT) of right peroneal vein ([place])    Rx / DC Orders ED Discharge Orders          Ordered    APIXABAN (ELIQUIS) VTE STARTER PACK (10MG  AND 5MG )        04/16/22 1630              Denario Bagot, Grafton Folk, PA-C 04/16/22 1639    Cristie Hem, MD 04/17/22 [phone]

## 2022-04-16 NOTE — ED Triage Notes (Signed)
Patient c/o right lower leg/foot pain since last night.  Patient endorses redness to toes and top of foot that is not blanchable.  Patient does have a history of nerve problems and has a history of alpha-gal.

## 2022-04-18 ENCOUNTER — Other Ambulatory Visit: Payer: Self-pay

## 2022-04-18 ENCOUNTER — Emergency Department (HOSPITAL_COMMUNITY)
Admission: EM | Admit: 2022-04-18 | Discharge: 2022-04-18 | Disposition: A | Discharge status: Home / Self Care | Payer: BC Managed Care – PPO | Attending: Emergency Medicine | Admitting: Emergency Medicine

## 2022-04-18 ENCOUNTER — Encounter (HOSPITAL_COMMUNITY): Payer: Self-pay | Admitting: *Deleted

## 2022-04-18 DIAGNOSIS — Z7901 Long term (current) use of anticoagulants: Secondary | ICD-10-CM | POA: Insufficient documentation

## 2022-04-18 DIAGNOSIS — I82451 Acute embolism and thrombosis of right peroneal vein: Secondary | ICD-10-CM

## 2022-04-18 DIAGNOSIS — M7989 Other specified soft tissue disorders: Secondary | ICD-10-CM | POA: Diagnosis not present

## 2022-04-18 NOTE — ED Provider Notes (Signed)
[facility] Provider Note   CSN: [id number] Arrival date & time: 04/18/22  2227     History  Chief Complaint  Patient presents with   Leg Pain    DVT    Aaron Martin is a 27 y.o. male.  Patient presents with concerns over swelling of his right lower leg and foot.  Patient was diagnosed with a distal DVT 2 days ago.  He has had 3 doses of Eliquis.  Tonight he noticed that the right foot was swollen and it seemed warm to the touch.  His left foot was cold and he had some numbness and tingling in his right hand.  He has not had any shortness of breath or chest pain.  Patient reports that now that he has gone to the ER, swelling of the foot has improved.       Home Medications Prior to Admission medications   Medication Sig Start Date End Date Taking? Authorizing Provider  ALPRAZolam (XANAX XR) 2 MG 24 hr tablet Take 2 mg by mouth daily. 11/02/19   [provider]  APIXABAN Arne Cleveland) VTE STARTER PACK (10MG  AND 5MG ) Take as directed on package: start with two-5mg  tablets twice daily for 7 days. On day 8, switch to one-5mg  tablet twice daily. 04/16/22   Schutt, Grafton Folk, PA-C  busPIRone (BUSPAR) 15 MG tablet Take 15 mg by mouth at bedtime. 09/11/19   [provider]  famotidine (PEPCID) 20 MG tablet Take 1 tablet (20 mg total) by mouth 2 (two) times daily. 11/30/20   Rancour, Annie Main, MD  famotidine (PEPCID) 20 MG tablet Take 1 tablet (20 mg total) by mouth 2 (two) times daily. 12/01/20   Rancour, Annie Main, MD  levothyroxine (SYNTHROID) 75 MCG tablet Take 75 mcg by mouth daily. 09/07/19   [provider]  predniSONE (DELTASONE) 50 MG tablet 1 tablet PO daily 11/30/20   Rancour, Annie Main, MD  predniSONE (DELTASONE) 50 MG tablet 1 tablet PO daily 12/01/20   Rancour, Annie Main, MD      Allergies    Other    Review of Systems   Review of Systems  Physical Exam Updated Vital Signs BP (!) 139/101 (BP Location: Right Arm)    Pulse 82   Temp 98 F (36.7 C) (Oral)   Resp 16   Ht 6' (1.829 m)   Wt 124.7 kg   SpO2 97%   BMI 37.30 kg/m  Physical Exam Vitals and nursing note reviewed.  Constitutional:      General: He is not in acute distress.    Appearance: He is well-developed.  HENT:     Head: Normocephalic and atraumatic.     Mouth/Throat:     Mouth: Mucous membranes are moist.  Eyes:     General: Vision grossly intact. Gaze aligned appropriately.     Extraocular Movements: Extraocular movements intact.     Conjunctiva/sclera: Conjunctivae normal.  Cardiovascular:     Rate and Rhythm: Normal rate and regular rhythm.     Pulses: Normal pulses.          Dorsalis pedis pulses are 2+ on the right side and 2+ on the left side.     Heart sounds: Normal heart sounds, S1 normal and S2 normal. No murmur heard.    No friction rub. No gallop.  Pulmonary:     Effort: Pulmonary effort is normal. No respiratory distress.     Breath sounds: Normal breath sounds.  Abdominal:  Palpations: Abdomen is soft.     Tenderness: There is no abdominal tenderness. There is no guarding or rebound.     Hernia: No hernia is present.  Musculoskeletal:        General: No swelling.     Cervical back: Full passive range of motion without pain, normal range of motion and neck supple. No pain with movement, spinous process tenderness or muscular tenderness. Normal range of motion.     Right lower leg: No edema.     Left lower leg: No edema.  Skin:    General: Skin is warm and dry.     Capillary Refill: Capillary refill takes less than 2 seconds.     Findings: No ecchymosis, erythema, lesion or wound.  Neurological:     Mental Status: He is alert and oriented to person, place, and time.     GCS: GCS eye subscore is 4. GCS verbal subscore is 5. GCS motor subscore is 6.     Cranial Nerves: Cranial nerves 2-12 are intact.     Sensory: Sensation is intact.     Motor: Motor function is intact. No weakness or abnormal muscle  tone.     Coordination: Coordination is intact.  Psychiatric:        Mood and Affect: Mood normal.        Speech: Speech normal.        Behavior: Behavior normal.     ED Results / Procedures / Treatments   Labs (all labs ordered are listed, but only abnormal results are displayed) Labs Reviewed - No data to display  EKG None  Radiology No results found.  Procedures Procedures    Medications Ordered in ED Medications - No data to display  ED Course/ Medical Decision Making/ A&P                             Medical Decision Making  Patient with previously diagnosed DVT, now on Eliquis.  He has had 3 doses.  No signs or symptoms of PE.  He was concerned about the amount of swelling he is experiencing in his right leg which was not present before.  He has, however, been doing some calf raises and exercises to increase the flow in his leg.  This will likely cause some of the swelling.  Now that he is in the exam room with the leg elevated, swelling has gone down.  No discoloration.  Negative exam for fear of worsening DVT.  No compartment syndrome, calf is soft.  No overlying skin changes to suggest infection.  Patient reassured, continue Eliquis, no further investigation necessary.        Final Clinical Impression(s) / ED Diagnoses Final diagnoses:  Acute deep vein thrombosis (DVT) of right peroneal vein [facility])    Rx / DC Orders ED Discharge Orders     None         Orpah Greek, MD 04/18/22 2322

## 2022-04-18 NOTE — ED Triage Notes (Addendum)
Pt diagnosed with DVT on 1/18.  Pt was started on Eliquis, tonight  began to have swelling to hands after taking the night time dose (pt's fourth dose) .  Denies any SOB. Also c/o tingling to right leg,  right foot swelling.

## 2022-05-04 ENCOUNTER — Encounter: Payer: Self-pay | Admitting: Neurology

## 2022-05-04 ENCOUNTER — Ambulatory Visit: Payer: BC Managed Care – PPO | Admitting: Neurology

## 2022-05-04 NOTE — Progress Notes (Deleted)
Home Neurology Division Clinic Note - Initial Visit   Date: 05/04/2022   Aaron Martin MRN: [id number] DOB: 06-07-1995   Dear Dr Marland KitchenSharilyn Sites, MD:  Thank you for your kind referral of Aaron Martin for consultation of ***. Although his history is well known to you, please allow [place] to reiterate it for the purpose of our medical record. The patient was accompanied to the clinic by *** who also provides collateral information.     Aaron Martin is a 27 y.o. ***-handed male with *** presenting for evaluation of ***.   IMPRESSION/PLAN: ****  Return to clinic in ***  ------------------------------------------------------------- History of present illness: ***  Out-side paper records, electronic medical record, and images have been reviewed where available and summarized as: *** MRI brian wwo contrast 08/11/2021: 1. Two punctate foci of FLAIR signal abnormality involving the posterior right periventricular and juxtacortical white matter of the right frontal lobe as above, nonspecific. No abnormal enhancement. 2. Otherwise normal brain MRI. No other acute intracranial abnormality.  MRI cervical spine wwo contrast 08/11/2021: 1. Normal MRI of the cervical spinal cord. No abnormal enhancement or evidence for demyelinating disease. 2. Left foraminal disc osteophyte complex at C7-T1 with resultant mild to moderate left C8 foraminal stenosis. 3. Mild noncompressive disc bulging at C5-6 without stenosis.   No results found for: "HGBA1C" No results found for: "VITAMINB12" No results found for: "TSH" No results found for: "ESRSEDRATE", "POCTSEDRATE"  Past Medical History:  Diagnosis Date   Allergy to alpha-gal    Asthma    Mild Tourette's syndrome    When Anxious   Seizures ([place])    febrile seizures    Past Surgical History:  Procedure Laterality Date   FLEXIBLE SIGMOIDOSCOPY N/A 08/17/2016   Procedure: FLEXIBLE SIGMOIDOSCOPY;  Surgeon: Danie Binder, MD;   Location: [facility] ENDO SUITE;  Service: Endoscopy;  Laterality: N/A;  900   TONSILLECTOMY       Medications:  Outpatient Encounter Medications as of 05/04/2022  Medication Sig   ALPRAZolam (XANAX XR) 2 MG 24 hr tablet Take 2 mg by mouth daily.   APIXABAN (ELIQUIS) VTE STARTER PACK ('10MG'$  AND '5MG'$ ) Take as directed on package: start with two-'5mg'$  tablets twice daily for 7 days. On day 8, switch to one-'5mg'$  tablet twice daily.   busPIRone (BUSPAR) 15 MG tablet Take 15 mg by mouth at bedtime.   famotidine (PEPCID) 20 MG tablet Take 1 tablet (20 mg total) by mouth 2 (two) times daily.   famotidine (PEPCID) 20 MG tablet Take 1 tablet (20 mg total) by mouth 2 (two) times daily.   levothyroxine (SYNTHROID) 75 MCG tablet Take 75 mcg by mouth daily.   predniSONE (DELTASONE) 50 MG tablet 1 tablet PO daily   predniSONE (DELTASONE) 50 MG tablet 1 tablet PO daily   No facility-administered encounter medications on file as of 05/04/2022.    Allergies:  Allergies  Allergen Reactions   Other     Alpha-gal     Family History: No family history on file.  Social History: Social History   Tobacco Use   Smoking status: Never   Smokeless tobacco: Never  Vaping Use   Vaping Use: Never used  Substance Use Topics   Alcohol use: Yes    Comment: occassion   Drug use: No   Social History   Social History Narrative   Not on file    Vital Signs:  There were no vitals taken for this visit.   General Medical  Exam:  *** General:  Well appearing, comfortable.   Eyes/ENT: see cranial nerve examination.   Neck:   No carotid bruits. Respiratory:  Clear to auscultation, good air entry bilaterally.   Cardiac:  Regular rate and rhythm, no murmur.   Extremities:  No deformities, edema, or skin discoloration.  Skin:  No rashes or lesions.  Neurological Exam: MENTAL STATUS including orientation to time, place, person, recent and remote memory, attention span and concentration, language, and fund of knowledge  is ***normal.  Speech is not dysarthric.  CRANIAL NERVES: II:  No visual field defects.  ***   III-IV-VI: Pupils equal round and reactive to light.  Normal conjugate, extra-ocular eye movements in all directions of gaze.  No nystagmus.  No ptosis***.   V:  Normal facial sensation.    VII:  Normal facial symmetry and movements.   VIII:  Normal hearing and vestibular function.   IX-X:  Normal palatal movement.   XI:  Normal shoulder shrug and head rotation.   XII:  Normal tongue strength and range of motion, no deviation or fasciculation.  MOTOR:  No atrophy, fasciculations or abnormal movements.  No pronator drift.   Upper Extremity:  Right  Left  Deltoid  5/5   5/5   Biceps  5/5   5/5   Triceps  5/5   5/5   Infraspinatus 5/5  5/5  Medial pectoralis 5/5  5/5  Wrist extensors  5/5   5/5   Wrist flexors  5/5   5/5   Finger extensors  5/5   5/5   Finger flexors  5/5   5/5   Dorsal interossei  5/5   5/5   Abductor pollicis  5/5   5/5   Tone (Ashworth scale)  0  0   Lower Extremity:  Right  Left  Hip flexors  5/5   5/5   Hip extensors  5/5   5/5   Adductor 5/5  5/5  Abductor 5/5  5/5  Knee flexors  5/5   5/5   Knee extensors  5/5   5/5   Dorsiflexors  5/5   5/5   Plantarflexors  5/5   5/5   Toe extensors  5/5   5/5   Toe flexors  5/5   5/5   Tone (Ashworth scale)  0  0   MSRs:                                           Right        Left brachioradialis 2+  2+  biceps 2+  2+  triceps 2+  2+  patellar 2+  2+  ankle jerk 2+  2+  Hoffman no  no  plantar response down  down   SENSORY:  Normal and symmetric perception of light touch, pinprick, vibration, and proprioception.  Romberg's sign absent.   COORDINATION/GAIT: Normal finger-to- nose-finger***.  Intact rapid alternating movements bilaterally.  Able to rise from a chair without using arms.  Gait narrow based and stable. Tandem and stressed gait intact.    ***   Thank you for allowing me to participate in patient's  care.  If I can answer any additional questions, I would be pleased to do so.    Sincerely,    Meriem Lemieux K. Posey Pronto, DO

## 2022-05-12 DIAGNOSIS — A6922 Other neurologic disorders in Lyme disease: Secondary | ICD-10-CM | POA: Diagnosis not present

## 2022-05-12 DIAGNOSIS — D6859 Other primary thrombophilia: Secondary | ICD-10-CM | POA: Diagnosis not present

## 2022-05-12 DIAGNOSIS — I82409 Acute embolism and thrombosis of unspecified deep veins of unspecified lower extremity: Secondary | ICD-10-CM | POA: Diagnosis not present

## 2022-05-18 DIAGNOSIS — A6922 Other neurologic disorders in Lyme disease: Secondary | ICD-10-CM | POA: Diagnosis not present

## 2022-05-18 DIAGNOSIS — I82409 Acute embolism and thrombosis of unspecified deep veins of unspecified lower extremity: Secondary | ICD-10-CM | POA: Diagnosis not present

## 2022-05-18 DIAGNOSIS — D6859 Other primary thrombophilia: Secondary | ICD-10-CM | POA: Diagnosis not present

## 2022-06-01 ENCOUNTER — Ambulatory Visit: Payer: BC Managed Care – PPO | Admitting: Neurology

## 2022-06-01 ENCOUNTER — Encounter: Payer: Self-pay | Admitting: Neurology

## 2022-06-01 VITALS — BP 126/79 | HR 94 | Ht 72.0 in | Wt 270.0 lb

## 2022-06-01 DIAGNOSIS — M79602 Pain in left arm: Secondary | ICD-10-CM | POA: Diagnosis not present

## 2022-06-01 DIAGNOSIS — M79601 Pain in right arm: Secondary | ICD-10-CM

## 2022-06-01 DIAGNOSIS — M542 Cervicalgia: Secondary | ICD-10-CM | POA: Diagnosis not present

## 2022-06-01 NOTE — Patient Instructions (Addendum)
We will refer you to start neck physiotherapy   Return to clinic in 3 months

## 2022-06-01 NOTE — Progress Notes (Signed)
[facility] Clinic Note - Initial Visit   Date: 06/01/2022   Aaron Martin MRN: [id number] DOB: [birth year]   Dear Dr. Hilma Favors:  Thank you for your kind referral of Aaron Martin for consultation of bilateral arm paresthesias. Although his history is well known to you, please allow [place] to reiterate it for the purpose of our medical record. The patient was accompanied to the clinic by self.    Aaron Martin is a 27 y.o. right handed male with Tourette's syndrome, left leg DVT, and alpha-gal presenting for evaluation of bilateral arm paresthesias.    IMPRESSION/PLAN: Bilateral arm paresthesias, ?radicular vs entrapment neuropathy.  MRI cervical spine shows mild-moderate left C8 foraminal stenosis, however these findings are not present on the right side.  I offered NCS/EMG of the arms to better localize symptoms.  He prefers to start neck PT and if no improvement, consider additional testing. Continue gabapentin '900mg'$  at bedtime.   Bilateral hand pain seems more musculoskeletal and localized to [facility] joint  Return to clinic in 3 months  ------------------------------------------------------------- History of present illness: Starting around November 2022, he began having burning and tingling over the arms, which is worse in the right hand.  Symptoms would occur episodically throughout the year.  When severe, he would wear a brace.  He has not done any physical therapy.   He takes gabapentin '900mg'$  at bedtime which helps.  He was seeing Dr. Merlene Laughter who ordered MRI brain and cervical spine. MRI cervical spine shows mild-moderate left C8 foraminal stenosis.  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   He lives at home with parents, brother, and fiance.   Out-side paper records, electronic medical record, and images have been reviewed where available and summarized as:  MRI brain  wwo contrast 08/11/2021: 1. Two punctate foci of FLAIR signal abnormality involving the posterior right periventricular and juxtacortical white matter of the right frontal lobe as above, nonspecific. No abnormal enhancement. 2. Otherwise normal brain MRI. No other acute intracranial abnormality.  MRI cervical spine 08/11/2021: 1. Normal MRI of the cervical spinal cord. No abnormal enhancement or evidence for demyelinating disease. 2. Left foraminal disc osteophyte complex at C7-T1 with resultant mild to moderate left C8 foraminal stenosis. 3. Mild noncompressive disc bulging at C5-6 without stenosis.    Past Medical History:  Diagnosis Date   Allergy to alpha-gal    Asthma    Mild Tourette's syndrome    When Anxious   Seizures ([place])    febrile seizures    Past Surgical History:  Procedure Laterality Date   FLEXIBLE SIGMOIDOSCOPY N/A 08/17/2016   Procedure: FLEXIBLE SIGMOIDOSCOPY;  Surgeon: Danie Binder, MD;  Location: [facility];  Service: Endoscopy;  Laterality: N/A;  900   TONSILLECTOMY       Medications:  Outpatient Encounter Medications as of 06/01/2022  Medication Sig   ALPRAZolam (XANAX XR) 2 MG 24 hr tablet Take 2 mg by mouth daily.   amphetamine-dextroamphetamine (ADDERALL XR) 15 MG 24 hr capsule Take by mouth daily.   amphetamine-dextroamphetamine (ADDERALL) 15 MG tablet Take 1 tablet by mouth daily.   APIXABAN (ELIQUIS) VTE STARTER PACK ('10MG'$  AND '5MG'$ ) Take as directed on package: start with two-'5mg'$  tablets twice daily for 7 days. On day 8, switch to one-'5mg'$  tablet twice daily. (Patient taking differently: Take as directed on package: start with two-'5mg'$  tablets twice daily for 7  days. On day 8, switch to one-'5mg'$  tablet twice daily.  06/01/2022- Patient takes '5mg'$  in the morning and '5mg'$  in the evening)   busPIRone (BUSPAR) 15 MG tablet Take 15 mg by mouth at bedtime.   famotidine (PEPCID) 20 MG tablet Take 1 tablet (20 mg total) by mouth 2 (two) times daily.   gabapentin  (NEURONTIN) 300 MG capsule Take 300 mg by mouth 3 (three) times daily. Take one capsule in the afternoon and take 2 capsules at bedtime.   levothyroxine (SYNTHROID) 75 MCG tablet Take 75 mcg by mouth daily. (Patient not taking: Reported on 06/01/2022)   predniSONE (DELTASONE) 50 MG tablet 1 tablet PO daily (Patient not taking: Reported on 06/01/2022)   predniSONE (DELTASONE) 50 MG tablet 1 tablet PO daily (Patient not taking: Reported on 06/01/2022)   [DISCONTINUED] famotidine (PEPCID) 20 MG tablet Take 1 tablet (20 mg total) by mouth 2 (two) times daily. (Patient not taking: Reported on 06/01/2022)   No facility-administered encounter medications on file as of 06/01/2022.    Allergies:  Allergies  Allergen Reactions   Other     Alpha-gal     Family History: History reviewed. No pertinent family history.  Social History: Social History   Tobacco Use   Smoking status: Never   Smokeless tobacco: Never  Vaping Use   Vaping Use: Every day  Substance Use Topics   Alcohol use: Yes    Comment: occassion   Drug use: No   Social History   Social History Narrative   Right handed    Lives in a two story home     Vital Signs:  BP 126/79   Pulse 94   Ht 6' (1.829 m)   Wt 270 lb (122.5 kg)   SpO2 97%   BMI 36.62 kg/m   Neurological Exam: MENTAL STATUS including orientation to time, place, person, recent and remote memory, attention span and concentration, language, and fund of knowledge is normal.  Speech is not dysarthric.  CRANIAL NERVES: II:  No visual field defects.     III-IV-VI: Pupils equal round and reactive to light.  Normal conjugate, extra-ocular eye movements in all directions of gaze.  No nystagmus.  No ptosis.   V:  Normal facial sensation.    VII:  Normal facial symmetry and movements.   VIII:  Normal hearing and vestibular function.   IX-X:  Normal palatal movement.   XI:  Normal shoulder shrug and head rotation.   XII:  Normal tongue strength and range of motion,  no deviation or fasciculation.  MOTOR:  Motor strength is 5/5 throughout.  No atrophy, fasciculations or abnormal movements.  No pronator drift.   MSRs:                                           Right        Left brachioradialis 2+  2+  biceps 2+  2+  triceps 2+  2+  patellar 2+  2+  ankle jerk 2+  2+  Hoffman no  no  plantar response down  down   SENSORY:  Normal and symmetric perception of light touch, pinprick, vibration, and temperature.  COORDINATION/GAIT: Normal finger-to- nose-finger.  Intact rapid alternating movements bilaterally.   Gait narrow based and stable. Tandem and stressed gait intact.      Thank you for allowing me to participate in patient's care.  If I can answer any additional questions, I would be pleased to do so.    Sincerely,    Misao Fackrell K. Posey Pronto, DO

## 2022-07-13 ENCOUNTER — Ambulatory Visit (HOSPITAL_COMMUNITY): Payer: BC Managed Care – PPO | Admitting: Physical Therapy

## 2022-08-19 DIAGNOSIS — F988 Other specified behavioral and emotional disorders with onset usually occurring in childhood and adolescence: Secondary | ICD-10-CM | POA: Diagnosis not present

## 2022-08-19 DIAGNOSIS — A6922 Other neurologic disorders in Lyme disease: Secondary | ICD-10-CM | POA: Diagnosis not present

## 2022-08-19 DIAGNOSIS — Z6837 Body mass index (BMI) 37.0-37.9, adult: Secondary | ICD-10-CM | POA: Diagnosis not present

## 2022-08-19 DIAGNOSIS — E6609 Other obesity due to excess calories: Secondary | ICD-10-CM | POA: Diagnosis not present

## 2022-08-19 DIAGNOSIS — F419 Anxiety disorder, unspecified: Secondary | ICD-10-CM | POA: Diagnosis not present

## 2022-08-19 DIAGNOSIS — I82409 Acute embolism and thrombosis of unspecified deep veins of unspecified lower extremity: Secondary | ICD-10-CM | POA: Diagnosis not present

## 2022-08-19 DIAGNOSIS — D6859 Other primary thrombophilia: Secondary | ICD-10-CM | POA: Diagnosis not present

## 2022-08-19 DIAGNOSIS — E039 Hypothyroidism, unspecified: Secondary | ICD-10-CM | POA: Diagnosis not present

## 2022-09-07 ENCOUNTER — Ambulatory Visit: Payer: BC Managed Care – PPO | Admitting: Neurology

## 2022-09-07 ENCOUNTER — Encounter: Payer: Self-pay | Admitting: Neurology

## 2022-09-07 DIAGNOSIS — Z029 Encounter for administrative examinations, unspecified: Secondary | ICD-10-CM

## 2022-12-18 DIAGNOSIS — F988 Other specified behavioral and emotional disorders with onset usually occurring in childhood and adolescence: Secondary | ICD-10-CM | POA: Diagnosis not present

## 2022-12-18 DIAGNOSIS — A6922 Other neurologic disorders in Lyme disease: Secondary | ICD-10-CM | POA: Diagnosis not present

## 2023-02-11 DIAGNOSIS — A6922 Other neurologic disorders in Lyme disease: Secondary | ICD-10-CM | POA: Diagnosis not present

## 2023-02-11 DIAGNOSIS — E039 Hypothyroidism, unspecified: Secondary | ICD-10-CM | POA: Diagnosis not present

## 2023-02-11 DIAGNOSIS — I82409 Acute embolism and thrombosis of unspecified deep veins of unspecified lower extremity: Secondary | ICD-10-CM | POA: Diagnosis not present

## 2023-02-11 DIAGNOSIS — Z6834 Body mass index (BMI) 34.0-34.9, adult: Secondary | ICD-10-CM | POA: Diagnosis not present

## 2023-02-11 DIAGNOSIS — D6859 Other primary thrombophilia: Secondary | ICD-10-CM | POA: Diagnosis not present

## 2023-02-11 DIAGNOSIS — F988 Other specified behavioral and emotional disorders with onset usually occurring in childhood and adolescence: Secondary | ICD-10-CM | POA: Diagnosis not present

## 2023-02-11 DIAGNOSIS — Z0001 Encounter for general adult medical examination with abnormal findings: Secondary | ICD-10-CM | POA: Diagnosis not present

## 2023-02-11 DIAGNOSIS — E6609 Other obesity due to excess calories: Secondary | ICD-10-CM | POA: Diagnosis not present

## 2023-02-11 DIAGNOSIS — Z1331 Encounter for screening for depression: Secondary | ICD-10-CM | POA: Diagnosis not present

## 2023-04-19 DIAGNOSIS — I82409 Acute embolism and thrombosis of unspecified deep veins of unspecified lower extremity: Secondary | ICD-10-CM | POA: Diagnosis not present

## 2023-04-19 DIAGNOSIS — E039 Hypothyroidism, unspecified: Secondary | ICD-10-CM | POA: Diagnosis not present

## 2023-04-19 DIAGNOSIS — F419 Anxiety disorder, unspecified: Secondary | ICD-10-CM | POA: Diagnosis not present

## 2023-07-09 DIAGNOSIS — F419 Anxiety disorder, unspecified: Secondary | ICD-10-CM | POA: Diagnosis not present

## 2023-07-09 DIAGNOSIS — F988 Other specified behavioral and emotional disorders with onset usually occurring in childhood and adolescence: Secondary | ICD-10-CM | POA: Diagnosis not present

## 2023-10-15 DIAGNOSIS — F988 Other specified behavioral and emotional disorders with onset usually occurring in childhood and adolescence: Secondary | ICD-10-CM | POA: Diagnosis not present

## 2023-10-15 DIAGNOSIS — A6922 Other neurologic disorders in Lyme disease: Secondary | ICD-10-CM | POA: Diagnosis not present

## 2023-10-15 DIAGNOSIS — Z6835 Body mass index (BMI) 35.0-35.9, adult: Secondary | ICD-10-CM | POA: Diagnosis not present

## 2023-10-15 DIAGNOSIS — E6609 Other obesity due to excess calories: Secondary | ICD-10-CM | POA: Diagnosis not present

## 2023-12-09 DIAGNOSIS — F988 Other specified behavioral and emotional disorders with onset usually occurring in childhood and adolescence: Secondary | ICD-10-CM | POA: Diagnosis not present

## 2024-01-17 DIAGNOSIS — E8809 Other disorders of plasma-protein metabolism, not elsewhere classified: Secondary | ICD-10-CM | POA: Diagnosis not present

## 2024-01-17 DIAGNOSIS — F411 Generalized anxiety disorder: Secondary | ICD-10-CM | POA: Diagnosis not present

## 2024-01-17 DIAGNOSIS — D6859 Other primary thrombophilia: Secondary | ICD-10-CM | POA: Diagnosis not present

## 2024-01-17 DIAGNOSIS — E039 Hypothyroidism, unspecified: Secondary | ICD-10-CM | POA: Diagnosis not present

## 2024-01-17 DIAGNOSIS — F988 Other specified behavioral and emotional disorders with onset usually occurring in childhood and adolescence: Secondary | ICD-10-CM | POA: Diagnosis not present
